# Patient Record
Sex: Female | Born: 1990 | Race: White | Hispanic: No | Marital: Single | Smoking: Former smoker
Health system: Southern US, Community
[De-identification: ages and names within clinical notes are randomized; demographics above are authoritative.]

## PROBLEM LIST (undated history)

## (undated) DIAGNOSIS — K449 Diaphragmatic hernia without obstruction or gangrene: Secondary | ICD-10-CM

## (undated) DIAGNOSIS — N83209 Unspecified ovarian cyst, unspecified side: Secondary | ICD-10-CM

## (undated) HISTORY — PX: SKIN GRAFT: SHX250

## (undated) HISTORY — PX: TOE AMPUTATION: SHX809

---

## 2014-03-04 ENCOUNTER — Encounter: Payer: Self-pay | Admitting: Emergency Medicine

## 2014-03-04 ENCOUNTER — Emergency Department
Admission: EM | Admit: 2014-03-04 | Discharge: 2014-03-04 | Disposition: A | Discharge status: Home / Self Care | Payer: Self-pay | Source: Home / Self Care | Attending: Family Medicine | Admitting: Family Medicine

## 2014-03-04 DIAGNOSIS — A499 Bacterial infection, unspecified: Secondary | ICD-10-CM

## 2014-03-04 DIAGNOSIS — Z202 Contact with and (suspected) exposure to infections with a predominantly sexual mode of transmission: Secondary | ICD-10-CM

## 2014-03-04 DIAGNOSIS — B9689 Other specified bacterial agents as the cause of diseases classified elsewhere: Secondary | ICD-10-CM

## 2014-03-04 DIAGNOSIS — N760 Acute vaginitis: Secondary | ICD-10-CM

## 2014-03-04 MED ORDER — METRONIDAZOLE 500 MG PO TABS: ORAL_TABLET | ORAL | Status: DC

## 2014-03-04 NOTE — ED Provider Notes (Signed)
CSN: [id number]     Arrival date & time 03/04/14  1030 History   First MD Initiated Contact with Patient 03/04/14 1126     Chief Complaint  Patient presents with  . Exposure to STD      HPI Comments: Patient states that she has just been informed by her sexual partner that he has dysuria, and she is concerned that she may have a STD.  She is assymptomatic at present.  She reports that she has had increased vaginal discharge for about two years, not associated with pain or irritation.  Patient's last menstrual period was 02/11/2014.    Patient is a 23 y.o. female presenting with STD exposure. The history is provided by the patient.  Exposure to STD This is a new problem. The current episode started yesterday. Associated symptoms comments: none.    History reviewed. No pertinent past medical history. Past Surgical History  Procedure Laterality Date  . 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 Diabetes Father   . Heart failure Father   . Hypertension Father    History  Substance Use Topics  . Smoking status: Former Games developer  . Smokeless tobacco: Not on file  . Alcohol Use: Yes   OB History   Grav Para Term Preterm Abortions TAB SAB Ect Mult Living                 Review of Systems  Constitutional: Negative for fever and chills.  Gastrointestinal: Negative for nausea and vomiting.  Genitourinary: Positive for vaginal discharge. Negative for dysuria, urgency, frequency, vaginal bleeding, difficulty urinating, genital sores, vaginal pain, menstrual problem, pelvic pain and dyspareunia.  All other systems reviewed and are negative.   Allergies  Review of patient's allergies indicates no known allergies.  Home Medications   Prior to Admission medications   Medication Sig Start Date End Date Taking? Authorizing Provider  metroNIDAZOLE (FLAGYL) 500 MG tablet Take one tab by mouth every 12 hours for 7 days. 03/04/14   Lattie Haw, MD   BP 125/77  Pulse 77  Temp(Src) 98.4 F (36.9 C) (Oral)  Resp 16  Ht 5\' 4"  (1.626 m)  Wt 170 lb (77.111 kg)  BMI 29.17 kg/m2  SpO2 99%  LMP 02/11/2014 Physical Exam Nursing notes and Vital Signs reviewed. Appearance:  Patient appears healthy, stated age, and in no acute distress Eyes:  Pupils are equal, round, and reactive to light and accomodation.  Extraocular movement is intact.  Conjunctivae are not inflamed  Pharynx:  Normal Neck:  Supple.  No adenopathy Lungs:  Clear to auscultation.  Breath sounds are equal.  Heart:  Regular rate and rhythm without murmurs, rubs, or gallops.  Abdomen:  Nontender without masses or hepatosplenomegaly.  Bowel sounds are present.  No CVA or flank tenderness.  Extremities:  No edema.  No calf tenderness Skin:  No rash present.  Pelvic exam deferred:  Patient instructed how to self obtain vaginal specimen.  ED Course  Procedures  none    Labs Reviewed  HIV ANTIBODY (ROUTINE TESTING)  HSV(HERPES SMPLX)ABS-I+II(IGG+IGM)-BLD  GC/CHLAMYDIA PROBE AMP, URINE  POCT WET + KOH PREP (DR PERFORMED @ [facility]);  Many clue cells.  No yeast, trich, WBC, RBC      MDM   1. Possible exposure to STD   2. Bacterial vaginosis    Begin Flagyl. GC/chlamydia, HIV, HSV pending. May try probiotic Fem-Dophilus for prevention of  BV Followup with Family Doctor if not improved in one week.     Lattie HawStephen A Thiago Ragsdale, MD 03/04/14 [id number]

## 2014-03-04 NOTE — ED Notes (Signed)
States partner having symptoms of STD so she wants to be checked; only reports chronic increase in vaginal discharge without odor or itching.

## 2014-03-04 NOTE — Discharge Instructions (Signed)
May try probiotic Fem-Dophilus for prevention of BV   Bacterial Vaginosis Bacterial vaginosis is a vaginal infection that occurs when the normal balance of bacteria in the vagina is disrupted. It results from an overgrowth of certain bacteria. This is the most common vaginal infection in women of childbearing age. Treatment is important to prevent complications, especially in pregnant women, as it can cause a premature delivery. CAUSES  Bacterial vaginosis is caused by an increase in harmful bacteria that are normally present in smaller amounts in the vagina. Several different kinds of bacteria can cause bacterial vaginosis. However, the reason that the condition develops is not fully understood. RISK FACTORS Certain activities or behaviors can put you at an increased risk of developing bacterial vaginosis, including:  Having a new sex partner or multiple sex partners.  Douching.  Using an intrauterine device (IUD) for contraception. Women do not get bacterial vaginosis from toilet seats, bedding, swimming pools, or contact with objects around them. SIGNS AND SYMPTOMS  Some women with bacterial vaginosis have no signs or symptoms. Common symptoms include:  Grey vaginal discharge.  A fishlike odor with discharge, especially after sexual intercourse.  Itching or burning of the vagina and vulva.  Burning or pain with urination. DIAGNOSIS  Your health care provider will take a medical history and examine the vagina for signs of bacterial vaginosis. A sample of vaginal fluid may be taken. Your health care provider will look at this sample under a microscope to check for bacteria and abnormal cells. A vaginal pH test may also be done.  TREATMENT  Bacterial vaginosis may be treated with antibiotic medicines. These may be given in the form of a pill or a vaginal cream. A second round of antibiotics may be prescribed if the condition comes back after treatment.  HOME CARE INSTRUCTIONS   Only  take over-the-counter or prescription medicines as directed by your health care provider.  If antibiotic medicine was prescribed, take it as directed. Make sure you finish it even if you start to feel better.  Do not have sex until treatment is completed.  Tell all sexual partners that you have a vaginal infection. They should see their health care provider and be treated if they have problems, such as a mild rash or itching.  Practice safe sex by using condoms and only having one sex partner. SEEK MEDICAL CARE IF:   Your symptoms are not improving after 3 days of treatment.  You have increased discharge or pain.  You have a fever. MAKE SURE YOU:   Understand these instructions.  Will watch your condition.  Will get help right away if you are not doing well or get worse. FOR MORE INFORMATION  Centers for Disease Control and Prevention, Division of STD Prevention: SolutionApps.co.zawww.cdc.gov/std American Sexual Health Association (ASHA): www.ashastd.org  Document Released: 07/29/2005 Document Revised: 05/19/2013 Document Reviewed: 03/10/2013 [facility] Patient Information 2015 [place], [place]. This information is not intended to replace advice given to you by your health care provider. Make sure you discuss any questions you have with your health care provider.

## 2014-03-05 LAB — HIV ANTIBODY (ROUTINE TESTING W REFLEX): HIV: NONREACTIVE

## 2014-03-05 LAB — GC/CHLAMYDIA PROBE AMP, URINE
CHLAMYDIA, SWAB/URINE, PCR: NEGATIVE
GC Probe Amp, Urine: NEGATIVE

## 2014-03-07 ENCOUNTER — Telehealth: Payer: Self-pay | Admitting: Emergency Medicine

## 2014-03-07 LAB — HSV(HERPES SMPLX)ABS-I+II(IGG+IGM)-BLD
HSV 1 Glycoprotein G Ab, IgG: 9.1 IV — ABNORMAL HIGH
HSV 2 Glycoprotein G Ab, IgG: <0.1 IV
Herpes Simplex Vrs I&II-IgM Ab (EIA): 0.8 INDEX

## 2014-03-07 LAB — POCT WET + KOH PREP
Bacteria Wet Prep HPF POC: 0
KOH Prep POC: NEGATIVE
RBC, Wet Prep: 0
WBC WET PREP: 0

## 2015-01-31 ENCOUNTER — Emergency Department (INDEPENDENT_AMBULATORY_CARE_PROVIDER_SITE_OTHER)
Admission: EM | Admit: 2015-01-31 | Discharge: 2015-01-31 | Disposition: A | Discharge status: Home / Self Care | Payer: Self-pay | Source: Home / Self Care | Attending: Emergency Medicine | Admitting: Emergency Medicine

## 2015-01-31 ENCOUNTER — Encounter: Payer: Self-pay | Admitting: *Deleted

## 2015-01-31 DIAGNOSIS — R300 Dysuria: Secondary | ICD-10-CM

## 2015-01-31 DIAGNOSIS — N3000 Acute cystitis without hematuria: Secondary | ICD-10-CM

## 2015-01-31 LAB — POCT URINALYSIS DIP (MANUAL ENTRY)
Blood, UA: NEGATIVE
Glucose, UA: 250 — AB
Nitrite, UA: POSITIVE — AB
Protein Ur, POC: 100 — AB
Spec Grav, UA: 1.01 (ref 1.005–1.03)
Urobilinogen, UA: >=8 (ref 0–1)
pH, UA: 5 (ref 5–8)

## 2015-01-31 MED ORDER — CIPROFLOXACIN HCL 500 MG PO TABS: 500.0000 mg | ORAL_TABLET | Freq: Two times a day (BID) | ORAL | Status: DC

## 2015-01-31 NOTE — ED Notes (Signed)
Pt c/o dysuria and urinary frequency x 2 days. Taking AZO. Denies fever.

## 2015-01-31 NOTE — ED Provider Notes (Signed)
CSN: [id number]     Arrival date & time 01/31/15  1726 History   First MD Initiated Contact with Patient 01/31/15 1731     Chief Complaint  Patient presents with  . Dysuria   (Consider location/radiation/quality/duration/timing/severity/associated sxs/prior Treatment) HPI This is a 24 y.o. female who presents today with UTI symptoms for 3 days.  + dysuria + frequency + urgency No hematuria No vaginal discharge No fever/chills No lower abdominal pain No nausea No vomiting No back pain No fatigue She denies chance of pregnancy.--- Her periods have been regular. Last menstrual period started 3 days ago and is ending today. Has tried over-the-counter--Azo- measures with mild improvement   History of UTIs in the past. Last UTI was about one year ago and resolved with antibiotic without sequelae. History reviewed. No pertinent past medical history. Past Surgical History  Procedure Laterality Date  . 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 Diabetes Father   . Heart failure Father   . Hypertension Father    History  Substance Use Topics  . Smoking status: Current Every Day Smoker -- 0.50 packs/day    Types: Cigarettes  . Smokeless tobacco: Not on file  . Alcohol Use: Yes   OB History    No data available     Review of Systems Remainder of Review of Systems negative for acute change except as noted in the HPI.  Allergies  Review of patient's allergies indicates no known allergies.  Home Medications   Prior to Admission medications   Medication Sig Start Date End Date Taking? Authorizing Provider  ciprofloxacin (CIPRO) 500 MG tablet Take 1 tablet (500 mg total) by mouth 2 (two) times daily. For 7 days 01/31/15   Lajean Manes, MD   BP 135/84 mmHg  Pulse 68  Temp(Src) 98.9 F (37.2 C) (Oral)  Resp 16  Ht 5\' 5"  (1.651 m)  Wt 190 lb (86.183 kg)  BMI 31.62 kg/m2  SpO2 97%  LMP 01/20/2015 Physical Exam   Constitutional: She is oriented to person, place, and time. She appears well-developed and well-nourished. No distress.  HENT:  Head: Normocephalic and atraumatic.  Eyes: Conjunctivae and EOM are normal. Pupils are equal, round, and reactive to light. No scleral icterus.  Neck: Normal range of motion.  Cardiovascular: Normal rate.   Pulmonary/Chest: Effort normal.  Abdominal: She exhibits no distension.  Musculoskeletal: Normal range of motion.  Neurological: She is alert and oriented to person, place, and time.  Skin: Skin is warm.  Psychiatric: She has a normal mood and affect.  Nursing note and vitals reviewed.  back and abdomen nontender. She denied any other exam ED Course  Procedures (including critical care time) Labs Review Labs Reviewed  POCT URINALYSIS DIP (MANUAL ENTRY) - Abnormal; Notable for the following:    Color, UA orange (*)    Glucose, UA =250 (*)    Bilirubin, UA moderate (*)    Bilirubin, UA small (15) (*)    Protein Ur, POC =100 (*)    Nitrite, UA Positive (*)    Leukocytes, UA large (3+) (*)    All other components within normal limits  URINE CULTURE    Imaging Review No results found.   MDM   1. Dysuria   2. Acute cystitis without hematuria    Treatment options discussed, as well as risks, benefits, alternatives. Patient voiced understanding and agreement with the following plans: Urine culture sent.  She denies any chance of pregnancy and is just finishing her normal menstrual period, will prescribe : Discharge Medication List as of 01/31/2015  6:00 PM    START taking these medications   Details  ciprofloxacin (CIPRO) 500 MG tablet Take 1 tablet (500 mg total) by mouth 2 (two) times daily. For 7 days, Starting 01/31/2015, Until Discontinued, Normal       Other symptomatic care discussed. Follow-up with your primary care doctor in 5-7 days if not improving, or sooner if symptoms become worse. Although she has no chronic symptoms of diabetes  and no history of diabetes, there is + glucose in urine. We discussed this briefly, and I advised her to follow up with PCP in 10 days to have urine rechecked to be sure that urinalysis is completely normalized. Precautions discussed. Red flags discussed. Questions invited and answered. Patient voiced understanding and agreement.     Lajean Manes, MD 01/31/15 [phone]

## 2015-02-02 LAB — URINE CULTURE
Colony Count: NO GROWTH
Organism ID, Bacteria: NO GROWTH

## 2015-02-03 ENCOUNTER — Telehealth: Payer: Self-pay | Admitting: Emergency Medicine

## 2015-12-16 ENCOUNTER — Emergency Department (INDEPENDENT_AMBULATORY_CARE_PROVIDER_SITE_OTHER): Payer: Self-pay

## 2015-12-16 ENCOUNTER — Emergency Department
Admission: EM | Admit: 2015-12-16 | Discharge: 2015-12-16 | Disposition: A | Discharge status: Home / Self Care | Payer: Self-pay | Source: Home / Self Care | Attending: Emergency Medicine | Admitting: Emergency Medicine

## 2015-12-16 ENCOUNTER — Encounter: Payer: Self-pay | Admitting: Emergency Medicine

## 2015-12-16 DIAGNOSIS — M25511 Pain in right shoulder: Secondary | ICD-10-CM

## 2015-12-16 DIAGNOSIS — S46911A Strain of unspecified muscle, fascia and tendon at shoulder and upper arm level, right arm, initial encounter: Secondary | ICD-10-CM

## 2015-12-16 MED ORDER — KETOROLAC TROMETHAMINE 60 MG/2ML IM SOLN
60.0000 mg | Freq: Once | INTRAMUSCULAR | Status: AC
  Administered 2015-12-16: 60 mg via INTRAMUSCULAR

## 2015-12-16 MED ORDER — TRAMADOL HCL 50 MG PO TABS: ORAL_TABLET | ORAL | Status: DC

## 2015-12-16 MED ORDER — MELOXICAM 15 MG PO TABS: 15.0000 mg | ORAL_TABLET | Freq: Every day | ORAL | Status: DC

## 2015-12-16 NOTE — ED Notes (Signed)
[facility] in [facility] right shoulder are after pushing herself up in a chair this morning. C/O of shooting sharp pain after feeling a and hearing a pop. Limited ROM and rates pain 5/10 sharp in nature radiates into neck.

## 2015-12-16 NOTE — Discharge Instructions (Signed)

## 2015-12-16 NOTE — ED Provider Notes (Addendum)
CSN: [id number]     Arrival date & time 12/16/15  1403 History   First MD Initiated Contact with Patient 12/16/15 1412     Chief Complaint  Patient presents with  . Shoulder Pain   (Consider location/radiation/quality/duration/timing/severity/associated sxs/prior Treatment) HPI [facility] Severe sharp, shooting pain in the right shoulder area after pushing herself up in a chair this morning. C/O of shooting sharp pain, 7 out of 10, after feeling and hearing a pop. Limited ROM. The right anterolateral shoulder pain is sharp, radiates to right lateral neck and radiates at times down right arm. Associated with feeling of numbness in right arm and hand and right hand feels cool . She denies prior injury or surgery right shoulder in the past, but had a left shoulder injury years ago that resolved without sequelae.  History reviewed. No pertinent past medical history. Past Surgical History  Procedure Laterality Date  . 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 Diabetes Father   . Heart failure Father   . Hypertension Father    Social History  Substance Use Topics  . Smoking status: Current Every Day Smoker -- 0.50 packs/day    Types: Cigarettes  . Smokeless tobacco: None  . Alcohol Use: Yes   OB History    No data available     Review of Systems  All other systems reviewed and are negative.   Allergies  Review of patient's allergies indicates no known allergies.  Home Medications   Prior to Admission medications   Medication Sig Start Date End Date Taking? Authorizing Provider  meloxicam (MOBIC) 15 MG tablet Take 1 tablet (15 mg total) by mouth daily. As needed for pain and inflammation. Take with food.  Do not take with other NSAIDs 12/16/15   Lajean Manesavid Massey, MD  traMADol Janean Sark(ULTRAM) 50 MG tablet Take 1 every 8 hours as needed for severe pain- May cause drowsiness 12/16/15   Lajean Manesavid Massey, MD   Meds Ordered and  Administered this Visit   Medications  ketorolac (TORADOL) injection 60 mg (60 mg Intramuscular Given 12/16/15 1505)    BP 162/85 mmHg  Pulse 84  Temp(Src) 98.4 F (36.9 C) (Oral)  Resp 16  Ht 5\' 4"  (1.626 m)  Wt 196 lb 8 oz (89.132 kg)  BMI 33.71 kg/m2  SpO2 100%  LMP 12/13/2015 No data found.   Physical Exam  Constitutional: She is oriented to person, place, and time. She appears well-developed and well-nourished. No distress.  Alert, cooperative, pleasant female. Uncomfortable from right shoulder pain. She holds and splints her right arm in order to splint right shoulder to avoid pain  HENT:  Head: Normocephalic and atraumatic.  Eyes: Conjunctivae and EOM are normal. Pupils are equal, round, and reactive to light. No scleral icterus.  Neck: Normal range of motion.  Cardiovascular: Normal rate.   Pulmonary/Chest: Effort normal.  Abdominal: She exhibits no distension.  Musculoskeletal:       Right shoulder: She exhibits decreased range of motion, tenderness, bony tenderness and pain. She exhibits no deformity, normal pulse and normal strength.       Cervical back: Normal. She exhibits normal range of motion, no tenderness, no bony tenderness and no swelling.  Motor and sensory and capillary refill distally intact. Radial pulse normal. Right hand is mildly cool temperature, exactly the same mild cool temperature as left hand.  Neurological: She is alert and oriented  to person, place, and time. No cranial nerve deficit. She exhibits normal muscle tone.  Motor and sensory upper extremities intact bilaterally  Skin: Skin is warm. No rash noted.  Psychiatric: She has a normal mood and affect.  Nursing note and vitals reviewed.   ED Course  Procedures (including critical care time)  Labs Review Labs Reviewed - No data to display  Imaging Review Dg Shoulder Right  12/16/2015  CLINICAL DATA:  Acute onset pain. EXAM: RIGHT SHOULDER - 2+ VIEW COMPARISON:  None. FINDINGS: Frontal,  Y scapular, and axillary images were obtained. No fracture or dislocation. The joint spaces appear normal. No erosive change or intra-articular calcification. IMPRESSION: No fracture or dislocation.  No appreciable arthropathy. Electronically Signed   By: Bretta Bang III M.D.   On: 12/16/2015 14:45      MDM   1. Right shoulder strain, initial encounter   Neurovascular right upper extremity intact  Treatment options discussed, as well as risks, benefits, alternatives. Patient voiced understanding and agreement with the following plans:  Shoulder immobilizer applied right shoulder, and that relieved some of the pain. Toradol 60 mg IM stat. After 30 minutes, that relieved some of the pain, and upon discharge, pain was 4 out of 10. New Prescriptions   MELOXICAM (MOBIC) 15 MG TABLET    Take 1 tablet (15 mg total) by mouth daily. As needed for pain and inflammation. Take with food.  Do not take with other NSAIDs   TRAMADOL (ULTRAM) 50 MG TABLET    Take 1 every 8 hours as needed for severe pain- May cause drowsiness    See detailed instructions in AVS, which were given to patient. Follow-up with orthopedics or sports medicine within one week Verbal instructions also given.  Red flags discussed. Questions invited and answered.  She voiced understanding and agreement with plans.   Lajean Manes, MD 12/16/15 Berton Bon  Lajean Manes, MD 12/16/15 2005

## 2015-12-21 ENCOUNTER — Telehealth: Payer: Self-pay | Admitting: Emergency Medicine

## 2016-04-24 ENCOUNTER — Emergency Department (INDEPENDENT_AMBULATORY_CARE_PROVIDER_SITE_OTHER)
Admission: EM | Admit: 2016-04-24 | Discharge: 2016-04-24 | Disposition: A | Discharge status: Home / Self Care | Payer: Self-pay | Source: Home / Self Care | Attending: Family Medicine | Admitting: Family Medicine

## 2016-04-24 ENCOUNTER — Encounter: Payer: Self-pay | Admitting: *Deleted

## 2016-04-24 DIAGNOSIS — R140 Abdominal distension (gaseous): Secondary | ICD-10-CM

## 2016-04-24 DIAGNOSIS — R1084 Generalized abdominal pain: Secondary | ICD-10-CM

## 2016-04-24 DIAGNOSIS — R198 Other specified symptoms and signs involving the digestive system and abdomen: Secondary | ICD-10-CM

## 2016-04-24 HISTORY — DX: Diaphragmatic hernia without obstruction or gangrene: K44.9

## 2016-04-24 HISTORY — DX: Unspecified ovarian cyst, unspecified side: N83.209

## 2016-04-24 LAB — POCT CBC W AUTO DIFF (K'VILLE URGENT CARE)

## 2016-04-24 MED ORDER — DICYCLOMINE HCL 20 MG PO TABS: 20.0000 mg | ORAL_TABLET | Freq: Two times a day (BID) | ORAL | 0 refills | Status: DC

## 2016-04-24 NOTE — ED Provider Notes (Signed)
CSN: [id number]     Arrival date & time 04/24/16  1302 History   First MD Initiated Contact with Patient 04/24/16 1334     Chief Complaint  Patient presents with  . Abdominal Pain   (Consider location/radiation/quality/duration/timing/severity/associated sxs/prior Treatment) HPI Jenna Norris is a 25 y.o. female presenting to UC with c/o generalized abdominal cramping and bloating that is worse on Left side of mid abdomen for about 9 days.  Pain waxes and wanes.  She did try Pepto-bismol once and omeprazole as she has hx of hiatal hernia but no relief. She also reports hx of ovarian cyst and had one rupture recently but states that is a different pain. Denies fever, chills, nausea or vomiting but has had intermittent diarrhea and constipation.  Denies hx of abdominal surgeries. No urinary or vaginal symptoms at this time.    Past Medical History:  Diagnosis Date  . Hiatal hernia   . Ovarian cyst    left    Past Surgical History:  Procedure Laterality Date  . SKIN GRAFT    . TOE AMPUTATION     repair after injury   Family History  Problem Relation Age of Onset  . Diabetes Father   . Heart failure Father   . Hypertension Father    Social History  Substance Use Topics  . Smoking status: Former Smoker    Packs/day: 0.50    Types: Cigarettes  . Smokeless tobacco: Never Used     Comment: uses vapes  . Alcohol use Yes   OB History    No data available     Review of Systems  Constitutional: Negative for chills and fever.  Gastrointestinal: Positive for abdominal pain ( Left side). Negative for diarrhea, nausea and vomiting.  Genitourinary: Positive for flank pain (Left side). Negative for dysuria, frequency, hematuria, pelvic pain and urgency.  Musculoskeletal: Negative for back pain and myalgias.    Allergies  Review of patient's allergies indicates no known allergies.  Home Medications   Prior to Admission medications   Medication Sig Start Date End Date Taking?  Authorizing Provider  omeprazole (PRILOSEC) 20 MG capsule Take 20 mg by mouth daily as needed.   Yes Historical Provider, MD  dicyclomine (BENTYL) 20 MG tablet Take 1 tablet (20 mg total) by mouth 2 (two) times daily. 04/24/16   Junius FinnerErin O'Malley, PA-C   Meds Ordered and Administered this Visit  Medications - No data to display  BP 126/82 (BP Location: Left Arm)   Pulse 78   Temp 98.4 F (36.9 C) (Oral)   Resp 16   Ht 5\' 4"  (1.626 m)   Wt 194 lb (88 kg)   LMP 04/03/2016   SpO2 99%   BMI 33.30 kg/m  No data found.   Physical Exam  Constitutional: She appears well-developed and well-nourished. No distress.  HENT:  Head: Normocephalic and atraumatic.  Eyes: Conjunctivae are normal. No scleral icterus.  Neck: Normal range of motion.  Cardiovascular: Normal rate, regular rhythm and normal heart sounds.   Pulmonary/Chest: Effort normal and breath sounds normal. No respiratory distress. She has no wheezes. She has no rales. She exhibits no tenderness.  Abdominal: Soft. Bowel sounds are normal. She exhibits no distension and no mass. There is tenderness. There is no rebound and no guarding.  Soft, non-distended, non-tender. No CVAT   Musculoskeletal: Normal range of motion.  Neurological: She is alert.  Skin: Skin is warm and dry. She is not diaphoretic.  Nursing note and vitals reviewed.  Urgent Care Course   Clinical Course    Procedures (including critical care time)  Labs Review Labs Reviewed  COMPLETE METABOLIC PANEL WITH GFR  POCT CBC W AUTO DIFF ([facility])    Imaging Review No results found.   MDM   1. Generalized abdominal pain   2. Bloating   3. Alternating constipation and diarrhea    Pt c/o intermittent abdominal pain for about 9 days. Intermittent diarrhea, constipation and bloating. Abd- soft, non-distended and non-tender.  LMP 3 weeks ago. Pt not concerned for pregnancy.   Not concerned for surgical abdomen at this time.    Labs: CBC-  WNL CMP- pending.  Rx: bentyl   Home care instructions provided. F/u with PCP in 1 week if not improving. Discussed symptoms that warrant emergent care in the ED. Patient verbalized understanding and agreement with treatment plan.    Junius Finner, PA-C 04/24/16 1526

## 2016-04-24 NOTE — ED Triage Notes (Signed)
Pt c/o LT sided mid abd pain x 9 days. She reports bloating, diarrhea,constipation and fatigue. Denies fever. Reports hx of hiatal hernia and LT side ovarian cyst rupture.

## 2016-04-25 LAB — COMPLETE METABOLIC PANEL WITH GFR
ALT: 11 U/L (ref 6–29)
AST: 12 U/L (ref 10–30)
Albumin: 4.2 g/dL (ref 3.6–5.1)
Alkaline Phosphatase: 44 U/L (ref 33–115)
BUN: 13 mg/dL (ref 7–25)
CO2: 26 mmol/L (ref 20–31)
Calcium: 9.2 mg/dL (ref 8.6–10.2)
Chloride: 107 mmol/L (ref 98–110)
Creat: 0.82 mg/dL (ref 0.50–1.10)
GFR, Est African American: >89 mL/min (ref >=60)
GFR, Est Non African American: >89 mL/min (ref >=60)
Glucose, Bld: 72 mg/dL (ref 65–99)
Potassium: 4.2 mmol/L (ref 3.5–5.3)
Sodium: 138 mmol/L (ref 135–146)
Total Bilirubin: 0.5 mg/dL (ref 0.2–1.2)
Total Protein: 6.4 g/dL (ref 6.1–8.1)

## 2016-04-26 NOTE — ED Triage Notes (Signed)
Pt advised of normal CMP. States she is still having diarrhea with eating. Advised her she needs to see a pcp. Gave her number to primary care and told her she needs to f./u with them as additional testing may be needed.

## 2016-05-21 ENCOUNTER — Ambulatory Visit (INDEPENDENT_AMBULATORY_CARE_PROVIDER_SITE_OTHER): Payer: Self-pay | Admitting: Osteopathic Medicine

## 2016-05-21 ENCOUNTER — Encounter: Payer: Self-pay | Admitting: Osteopathic Medicine

## 2016-05-21 VITALS — BP 129/77 | HR 80 | Ht 64.0 in | Wt 191.0 lb

## 2016-05-21 DIAGNOSIS — R140 Abdominal distension (gaseous): Secondary | ICD-10-CM

## 2016-05-21 DIAGNOSIS — Z8719 Personal history of other diseases of the digestive system: Secondary | ICD-10-CM

## 2016-05-21 DIAGNOSIS — R198 Other specified symptoms and signs involving the digestive system and abdomen: Secondary | ICD-10-CM

## 2016-05-21 DIAGNOSIS — Z23 Encounter for immunization: Secondary | ICD-10-CM

## 2016-05-21 MED ORDER — DICYCLOMINE HCL 20 MG PO TABS: 20.0000 mg | ORAL_TABLET | Freq: Three times a day (TID) | ORAL | 0 refills | Status: DC

## 2016-05-21 NOTE — Patient Instructions (Signed)
Avoid the following foods, feel free to Google recipes and shopping lists!  2017 UpToDate Characteristics and sources of common FODMAPs  Word that corresponds to letter in acronym Compounds in this category Foods that contain these compounds  F Fermentable  O Oligosaccharides Fructans, galacto-oligosaccharides Wheat, barley, rye, onion, leek, white part of spring onion, garlic, shallots, artichokes, beetroot, fennel, peas, chicory, pistachio, cashews, legumes, lentils, and chickpeas  D Disaccharides Lactose Milk, custard, ice cream, and yogurt  M Monosaccharides "Free fructose" (fructose in excess of glucose) Apples, pears, mangoes, cherries, watermelon, asparagus, sugar snap peas, honey, high-fructose corn syrup  A And  P Polyols Sorbitol, mannitol, maltitol, and xylitol Apples, pears, apricots, cherries, nectarines, peaches, plums, watermelon, mushrooms, cauliflower, artificially sweetened chewing gum and confectionery  FODMAPs: fermentable oligosaccharides, disaccharides, monosaccharides, and polyols. Adapted by permission from [employer]: [employer] Br[employer]an Journal of Gastroenterology. Lonell FaceShepherd SJ, Lomer MC, ZacharyGibson [place]. Short-chain carbohydrates and functional gastrointestinal disorders. Am J Gastroenterol 2013; 108:707. Copyright  2013. www.nature.com/ajg. Graphic 1610990186 Version 2.0  Ok to use Imodium as needed for diarrhea. We placed referral for GI for further evaluation if you're not doing any better.

## 2016-05-21 NOTE — Progress Notes (Signed)
HPI: Jenna Norris is a 25 y.o. female  who presents to [facility] today, 05/21/16,  for chief complaint of:  Chief Complaint  Patient presents with  . Establish Care    ABDOMINAL BLOATING    Has had stomach issues with food. Causing some bloating/pain on left upper abdomen/flank, as well as frequent loose stool, occasionally multiple times per day, is not waking her up at night. Symptoms happen particularly with consumption of gluten and dairy but elimination diet wasn't very helpful. Bloating overall is doing a bit better since was given prescription for Bentyl from urgent care. Overall has been going on for about 2 months. History of hiatal hernia, intermittently taking PPI as needed.   Past medical, surgical, social and family history reviewed: Past Medical History:  Diagnosis Date  . Hiatal hernia   . Ovarian cyst    left    Past Surgical History:  Procedure Laterality Date  . SKIN GRAFT    . 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 Smoking status: Former Smoker    Packs/day: 0.50    Types: Cigarettes  . Smokeless tobacco: Never Used     Comment: uses vapes  . Alcohol use Yes   Family History  Problem Relation Age of Onset  . Diabetes Father   . Heart failure Father   . Hypertension Father   . Hyperlipidemia Father   . Cancer Paternal Grandmother     BREAST AND LEUKEMIA     Current medication list and allergy/intolerance information reviewed:   Current Outpatient Prescriptions  Medication Sig Dispense Refill  . Biotin 1000 MCG tablet Take 1,000 mcg by mouth 3 (three) times daily.    Marland Kitchen omeprazole (PRILOSEC) 20 MG capsule Take 20 mg by mouth daily as needed.    . Prenatal Vit-Fe Fumarate-FA (PRENATAL MULTIVITAMIN) TABS tablet Take 1 tablet by mouth daily at 12 noon.     No current facility-administered medications for this visit.    No Known Allergies    Review of  Systems:  Constitutional:  No  fever, no chills, No recent illness, No unintentional weight changes. No significant fatigue.   HEENT: No  headache, no vision change  Cardiac: No  chest pain, No  pressure  Respiratory:  No  shortness of breath.   Gastrointestinal: +abdominal pain, +nausea, No  vomiting,  No  blood in stool, +diarrhea, +constipation   Musculoskeletal: No new myalgia/arthralgia  Genitourinary: No  incontinence, No  abnormal genital bleeding, No abnormal genital discharge  Skin: No  Rash, No other wounds/concerning lesions  Hem/Onc: No  easy bruising/bleeding, No  abnormal lymph node  Endocrine: No cold intolerance,  No heat intolerance. No polyuria/polydipsia/polyphagia   Neurologic: No  weakness, No  dizziness,  Psychiatric: No  concerns with depression, No  concerns with anxiety  Exam:  BP 129/77   Pulse 80   Ht 5\' 4"  (1.626 m)   Wt 191 lb (86.6 kg)   LMP 04/03/2016   BMI 32.79 kg/m   Constitutional: VS see above. General Appearance: alert, well-developed, well-nourished, NAD  Eyes: Normal lids and conjunctive, non-icteric sclera  Ears, Nose, Mouth, Throat: MMM, Normal external inspection ears/nares/mouth/lips/gums.   Neck: No masses, trachea midline. No thyroid enlargement.   Respiratory: Normal respiratory effort. no wheeze, no rhonchi, no rales  Cardiovascular: S1/S2 normal, no murmur, no rub/gallop auscultated. RRR. No lower extremity edema.   Gastrointestinal: Nontender, no masses. No  hepatomegaly, no splenomegaly. No hernia appreciated. Bowel sounds normal. Rectal exam deferred.   Musculoskeletal: Gait normal.   Neurological: Normal balance/coordination. No tremor.   Skin: warm, dry, intact. No rash/ulcer.   Psychiatric: Normal judgment/insight. Normal mood and affect. Oriented x3.    ASSESSMENT/PLAN:   Alternating constipation and diarrhea - Plan: TSH, Tissue transglutaminase, IgA, Tissue transglutaminase, IgG, dicyclomine (BENTYL) 20  MG tablet, Ambulatory referral to Gastroenterology  Need for prophylactic vaccination and inoculation against influenza - Plan: Flu Vaccine QUAD 36+ mos IM  History of hiatal hernia - Plan: Ambulatory referral to Gastroenterology  Abdominal bloating - Plan: Ambulatory referral to Gastroenterology   Patient Instructions   Avoid the following foods, feel free to Google recipes and shopping lists!  2017 UpToDate Characteristics and sources of common FODMAPs  Word that corresponds to letter in acronym Compounds in this category Foods that contain these compounds  F Fermentable  O Oligosaccharides Fructans, galacto-oligosaccharides Wheat, barley, rye, onion, leek, white part of spring onion, garlic, shallots, artichokes, beetroot, fennel, peas, chicory, pistachio, cashews, legumes, lentils, and chickpeas  D Disaccharides Lactose Milk, custard, ice cream, and yogurt  M Monosaccharides "Free fructose" (fructose in excess of glucose) Apples, pears, mangoes, cherries, watermelon, asparagus, sugar snap peas, honey, high-fructose corn syrup  A And  P Polyols Sorbitol, mannitol, maltitol, and xylitol Apples, pears, apricots, cherries, nectarines, peaches, plums, watermelon, mushrooms, cauliflower, artificially sweetened chewing gum and confectionery  FODMAPs: fermentable oligosaccharides, disaccharides, monosaccharides, and polyols. Adapted by permission from [employer]: [employer] Journal of Gastroenterology. Lonell FaceShepherd SJ, Lomer MC, [employer]. Short-chain carbohydrates and functional gastrointestinal disorders. Am J Gastroenterol 2013; 108:707. Copyright  2013. www.nature.com/ajg. Graphic [id number] Version 2.0  Ok to use Imodium as needed for diarrhea. We placed referral for GI for further evaluation if you're not doing any better.    Visit summary with medication list and pertinent instructions was printed for patient to review. All questions at time of visit were answered - patient  instructed to contact office with any additional concerns. ER/RTC precautions were reviewed with the patient. Follow-up plan: Return if symptoms worsen or fail to improve.

## 2016-05-22 ENCOUNTER — Encounter: Payer: Self-pay | Admitting: Gastroenterology

## 2016-07-23 ENCOUNTER — Encounter: Payer: Self-pay | Admitting: Gastroenterology

## 2016-07-24 ENCOUNTER — Ambulatory Visit: Payer: Self-pay | Admitting: Gastroenterology

## 2016-10-09 ENCOUNTER — Ambulatory Visit: Payer: Self-pay | Admitting: Gastroenterology

## 2016-10-09 ENCOUNTER — Encounter: Payer: Self-pay | Admitting: *Deleted

## 2016-11-25 ENCOUNTER — Emergency Department
Admission: EM | Admit: 2016-11-25 | Discharge: 2016-11-25 | Disposition: A | Discharge status: Home / Self Care | Payer: Self-pay | Source: Home / Self Care | Attending: Family Medicine | Admitting: Family Medicine

## 2016-11-25 ENCOUNTER — Encounter: Payer: Self-pay | Admitting: Emergency Medicine

## 2016-11-25 DIAGNOSIS — Z202 Contact with and (suspected) exposure to infections with a predominantly sexual mode of transmission: Secondary | ICD-10-CM

## 2016-11-25 DIAGNOSIS — N760 Acute vaginitis: Secondary | ICD-10-CM

## 2016-11-25 DIAGNOSIS — B9689 Other specified bacterial agents as the cause of diseases classified elsewhere: Secondary | ICD-10-CM

## 2016-11-25 MED ORDER — METRONIDAZOLE 500 MG PO TABS: ORAL_TABLET | ORAL | 0 refills | Status: DC

## 2016-11-25 NOTE — ED Triage Notes (Addendum)
Pt c/o yellow vaginal d/c that started yesterday. Denies odor or itching. No recent intercourse.  Password [email or web address]

## 2016-11-25 NOTE — ED Provider Notes (Signed)
[facility]    CSN: [id number] Arrival date & time: 11/25/16  1731     History   Chief Complaint Chief Complaint  Patient presents with  . Vaginal Discharge    HPI Jenna Norris is a 26 y.o. female.   Patient reports that she developed light vaginal discharge one week ago, and the discharge became yellowish yesterday without abdominal or pelvic pain.  No fevers, chills, and sweats.  No nausea/vomiting.  Last menstrual period November 08, 2016.  She states that she would like to be tested for STD's.  She has a history of HSV1.   The history is provided by the patient.  Vaginal Discharge  Quality:  Yellow Severity:  Mild Onset quality:  Gradual Duration:  1 week Timing:  Intermittent Progression:  Worsening Chronicity:  New Context: not recent antibiotic use   Relieved by:  None tried Worsened by:  Nothing Ineffective treatments:  None tried Associated symptoms: no abdominal pain, no dyspareunia, no dysuria, no fever, no genital lesions, no nausea, no rash, no urinary frequency, no urinary hesitancy, no urinary incontinence, no vaginal itching and no vomiting     Past Medical History:  Diagnosis Date  . Hiatal hernia   . Ovarian cyst    left     There are no active problems to display for this patient.   Past Surgical History:  Procedure Laterality Date  . SKIN GRAFT    . 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 Authorizing Provider  Biotin 1000 MCG tablet Take 1,000 mcg by mouth 3 (three) times daily.    Historical Provider, MD  dicyclomine (BENTYL) 20 MG tablet Take 1 tablet (20 mg total) by mouth 4 (four) times daily -  before meals and at bedtime. As needed for bloating/cramping 05/21/16   Sunnie Nielsen, DO  metroNIDAZOLE (FLAGYL) 500 MG tablet Take one tab by mouth every 12 hours for 7 days. 11/25/16    Lattie Haw, MD  omeprazole (PRILOSEC) 20 MG capsule Take 20 mg by mouth daily as needed.    Historical Provider, MD  Prenatal Vit-Fe Fumarate-FA (PRENATAL MULTIVITAMIN) TABS tablet Take 1 tablet by mouth daily at 12 noon.    Historical Provider, MD    Family History Family History  Problem Relation Age of Onset  . Diabetes Father   . Heart failure Father   . Hypertension Father   . Hyperlipidemia Father   . Cancer Paternal Grandmother     BREAST AND LEUKEMIA    Social History Social History  Substance Use Topics  . Smoking status: Current Every Day Smoker    Packs/day: 0.50    Types: Cigarettes  . Smokeless tobacco: Never Used     Comment: uses vapes  . Alcohol use Yes     Allergies   Patient has no known allergies.   Review of Systems Review of Systems  Constitutional: Negative for fever.  Gastrointestinal: Negative for abdominal pain, nausea and vomiting.  Genitourinary: Positive for vaginal discharge. Negative for bladder incontinence, dyspareunia, dysuria and hesitancy.  All other systems reviewed and are negative.    Physical Exam Triage Vital Signs ED Triage Vitals [11/25/16 1809]  Enc Vitals Group     BP (!) 146/93     Pulse Rate 69  Resp      Temp 98.5 F (36.9 C)     Temp Source Oral     SpO2 97 %     Weight 204 lb (92.5 kg)     Height      Head Circumference      Peak Flow      Pain Score 3     Pain Loc      Pain Edu?      Excl. in GC?    No data found.   Updated Vital Signs BP (!) 146/93 (BP Location: Right Arm)   Pulse 69   Temp 98.5 F (36.9 C) (Oral)   Wt 204 lb (92.5 kg)   SpO2 97%   BMI 35.02 kg/m   Visual Acuity Right Eye Distance:   Left Eye Distance:   Bilateral Distance:    Right Eye Near:   Left Eye Near:    Bilateral Near:     Physical Exam Nursing notes and Vital Signs reviewed. Appearance:  Patient appears stated age, and in no acute distress.    Eyes:  Pupils are equal, round, and reactive to light  and accomodation.  Extraocular movement is intact.  Conjunctivae are not inflamed   Pharynx:  Normal; moist mucous membranes  Neck:  Supple.  No adenopathy Lungs:  Clear to auscultation.  Breath sounds are equal.  Moving air well. Heart:  Regular rate and rhythm without murmurs, rubs, or gallops.  Abdomen:  Nontender without masses or hepatosplenomegaly.  Bowel sounds are present.  No CVA or flank tenderness.  Extremities:  No edema.  Skin:  No rash present.     UC Treatments / Results  Labs (all labs ordered are listed, but only abnormal results are displayed) Labs Reviewed  GC/CHLAMYDIA PROBE AMP  HIV ANTIBODY (ROUTINE TESTING)  RPR  HSV(HERPES SIMPLEX VRS) I + II AB-IGG  POCT WET + KOH PREP:  Epithelial cells adequate; Clue cells present; WBC's moderate, no Trich, no yeast.    EKG  EKG Interpretation None       Radiology No results found.  Procedures Procedures (including critical care time)  Medications Ordered in UC Medications - No data to display   Initial Impression / Assessment and Plan / UC Course  I have reviewed the triage vital signs and the nursing notes.  Pertinent labs & imaging results that were available during my care of the patient were reviewed by me and considered in my medical decision making (see chart for details).    Begin Flagyl. GC/chlamydia, RPR, HIV, and HSV 1 & 2 IgG pending. Followup with Family Doctor if not improved in one week.     Final Clinical Impressions(s) / UC Diagnoses   Final diagnoses:  Possible exposure to STD  BV (bacterial vaginosis)    New Prescriptions New Prescriptions   METRONIDAZOLE (FLAGYL) 500 MG TABLET    Take one tab by mouth every 12 hours for 7 days.     Lattie Haw, MD 12/07/16 [phone]

## 2016-11-26 LAB — HIV ANTIBODY (ROUTINE TESTING W REFLEX): HIV 1&2 Ab, 4th Generation: NONREACTIVE

## 2016-11-26 LAB — HSV(HERPES SIMPLEX VRS) I + II AB-IGG
HSV 1 GLYCOPROTEIN G AB, IGG: 47.5 {index} — AB (ref <0.90)
HSV 2 Glycoprotein G Ab, IgG: <0.9 Index (ref <0.90)

## 2016-11-26 LAB — GC/CHLAMYDIA PROBE AMP
CT PROBE, AMP APTIMA: NOT DETECTED
GC Probe RNA: NOT DETECTED

## 2016-11-26 LAB — RPR

## 2016-11-27 ENCOUNTER — Telehealth: Payer: Self-pay | Admitting: Emergency Medicine

## 2017-02-25 ENCOUNTER — Encounter: Payer: Self-pay | Admitting: *Deleted

## 2017-02-25 ENCOUNTER — Emergency Department
Admission: EM | Admit: 2017-02-25 | Discharge: 2017-02-25 | Disposition: A | Discharge status: Home / Self Care | Payer: Self-pay | Source: Home / Self Care | Attending: Family Medicine | Admitting: Family Medicine

## 2017-02-25 DIAGNOSIS — R300 Dysuria: Secondary | ICD-10-CM

## 2017-02-25 MED ORDER — NITROFURANTOIN MONOHYD MACRO 100 MG PO CAPS: 100.0000 mg | ORAL_CAPSULE | Freq: Two times a day (BID) | ORAL | 0 refills | Status: DC

## 2017-02-25 NOTE — Discharge Instructions (Signed)
Increase fluid intake. May use non-prescription AZO for about two days, if desired, to decrease urinary discomfort.  If symptoms become significantly worse during the night or over the weekend, proceed to the local emergency room.  

## 2017-02-25 NOTE — ED Provider Notes (Signed)
[facility]    CSN: [id number] Arrival date & time: 02/25/17  1331     History   Chief Complaint Chief Complaint  Patient presents with  . Dysuria  . Urinary Frequency    HPI Jenna Norris is a 26 y.o. female.   Yesterday patient developed frequency.  Today she had dysuria and lower back ache.  Patient's last menstrual period was 02/20/2017.  No abdominal or pelvic pain.  No fevers, chills, and sweats.  No nausea/vomiting.   The history is provided by the patient.  Dysuria  Pain quality:  Burning Pain severity:  Mild Onset quality:  Sudden Duration:  1 day Timing:  Constant Progression:  Worsening Chronicity:  New Recent urinary tract infections: no   Relieved by:  Phenazopyridine Worsened by:  Nothing Ineffective treatments:  None tried Urinary symptoms: frequent urination and hesitancy   Urinary symptoms: no discolored urine, no foul-smelling urine, no hematuria and no bladder incontinence   Associated symptoms: flank pain   Associated symptoms: no abdominal pain, no fever, no genital lesions, no nausea, no vaginal discharge and no vomiting   Risk factors: no recurrent urinary tract infections     Past Medical History:  Diagnosis Date  . Hiatal hernia   . Ovarian cyst    left     There are no active problems to display for this patient.   Past Surgical History:  Procedure Laterality Date  . SKIN GRAFT    . 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 Authorizing Provider  Biotin 1000 MCG tablet Take 1,000 mcg by mouth 3 (three) times daily.    [provider]  nitrofurantoin, macrocrystal-monohydrate, (MACROBID) 100 MG capsule Take 1 capsule (100 mg total) by mouth 2 (two) times daily. Take with food. 02/25/17   Lattie Haw, MD  omeprazole (PRILOSEC) 20 MG capsule Take 20 mg by mouth daily  as needed.    [provider]    Family History Family History  Problem Relation Age of Onset  . Diabetes Father   . Heart failure Father   . Hypertension Father   . Hyperlipidemia Father   . Cancer Paternal Grandmother        BREAST AND LEUKEMIA    Social History Social History  Substance Use Topics  . Smoking status: Current Every Day Smoker    Packs/day: 0.50    Types: Cigarettes  . Smokeless tobacco: Never Used     Comment: uses vapes  . Alcohol use Yes     Allergies   Patient has no known allergies.   Review of Systems Review of Systems  Constitutional: Negative for fever.  Gastrointestinal: Negative for abdominal pain, nausea and vomiting.  Genitourinary: Positive for dysuria and flank pain. Negative for vaginal discharge.  All other systems reviewed and are negative.    Physical Exam Triage Vital Signs ED Triage Vitals [02/25/17 1357]  Enc Vitals Group     BP 134/87     Pulse Rate 94     Resp      Temp 98.6 F (37 C)     Temp Source Oral     SpO2 94 %     Weight 204 lb (92.5 kg)     Height      Head Circumference  Peak Flow      Pain Score 2     Pain Loc      Pain Edu?      Excl. in GC?    No data found.   Updated Vital Signs BP 134/87 (BP Location: Left Arm)   Pulse 94   Temp 98.6 F (37 C) (Oral)   Wt 204 lb (92.5 kg)   LMP 02/20/2017   SpO2 94%   BMI 35.02 kg/m   Visual Acuity Right Eye Distance:   Left Eye Distance:   Bilateral Distance:    Right Eye Near:   Left Eye Near:    Bilateral Near:     Physical Exam Nursing notes and Vital Signs reviewed. Appearance:  Patient appears stated age, and in no acute distress.    Eyes:  Pupils are equal, round, and reactive to light and accomodation.  Extraocular movement is intact.  Conjunctivae are not inflamed   Pharynx:  Normal; moist mucous membranes  Neck:  Supple.  No adenopathy Lungs:  Clear to auscultation.  Breath sounds are equal.  Moving air well. Heart:   Regular rate and rhythm without murmurs, rubs, or gallops.  Abdomen:  Mild tenderness over bladder without masses or hepatosplenomegaly.  Bowel sounds are present.  Mild left flank tenderness present.   Extremities:  No edema.  Skin:  No rash present.     UC Treatments / Results  Labs (all labs ordered are listed, but only abnormal results are displayed) Labs Reviewed  URINE CULTURE    EKG  EKG Interpretation None       Radiology No results found.  Procedures Procedures (including critical care time)  Medications Ordered in UC Medications - No data to display   Initial Impression / Assessment and Plan / UC Course  I have reviewed the triage vital signs and the nursing notes.  Pertinent labs & imaging results that were available during my care of the patient were reviewed by me and considered in my medical decision making (see chart for details).    Urine culture pending. Begin Macrobid 100mg  BID for one week. May use non-prescription AZO for about two days, if desired, to decrease urinary discomfort.  Increase fluid intake. If symptoms become significantly worse during the night or over the weekend, proceed to the local emergency room.  Followup with Family Doctor if not improved in one week.     Final Clinical Impressions(s) / UC Diagnoses   Final diagnoses:  Dysuria    New Prescriptions New Prescriptions   NITROFURANTOIN, MACROCRYSTAL-MONOHYDRATE, (MACROBID) 100 MG CAPSULE    Take 1 capsule (100 mg total) by mouth 2 (two) times daily. Take with food.     Lattie HawBeese, Jacklyn Branan A, MD 02/25/17 [phone]

## 2017-02-25 NOTE — ED Triage Notes (Signed)
Patient c/o 2 days of dysuria and urinary frequency. Taken AZO otc.

## 2017-02-26 ENCOUNTER — Telehealth: Payer: Self-pay | Admitting: Emergency Medicine

## 2017-02-26 LAB — URINE CULTURE

## 2018-08-12 ENCOUNTER — Other Ambulatory Visit: Payer: Self-pay | Admitting: Emergency Medicine

## 2018-08-12 ENCOUNTER — Emergency Department (INDEPENDENT_AMBULATORY_CARE_PROVIDER_SITE_OTHER)
Admission: EM | Admit: 2018-08-12 | Discharge: 2018-08-12 | Disposition: A | Discharge status: Home / Self Care | Payer: Self-pay | Source: Home / Self Care | Attending: Emergency Medicine | Admitting: Emergency Medicine

## 2018-08-12 ENCOUNTER — Other Ambulatory Visit: Payer: Self-pay

## 2018-08-12 ENCOUNTER — Encounter: Payer: Self-pay | Admitting: *Deleted

## 2018-08-12 DIAGNOSIS — Z113 Encounter for screening for infections with a predominantly sexual mode of transmission: Secondary | ICD-10-CM

## 2018-08-12 DIAGNOSIS — R300 Dysuria: Secondary | ICD-10-CM

## 2018-08-12 DIAGNOSIS — N3001 Acute cystitis with hematuria: Secondary | ICD-10-CM

## 2018-08-12 LAB — POCT URINALYSIS DIP (MANUAL ENTRY)
Bilirubin, UA: NEGATIVE
Glucose, UA: NEGATIVE mg/dL
Ketones, POC UA: NEGATIVE mg/dL
Nitrite, UA: POSITIVE — AB
Protein Ur, POC: NEGATIVE mg/dL
Spec Grav, UA: 1.01 (ref 1.010–1.025)
Urobilinogen, UA: 0.2 E.U./dL
pH, UA: 6 (ref 5.0–8.0)

## 2018-08-12 MED ORDER — CIPROFLOXACIN HCL 500 MG PO TABS: 500.0000 mg | ORAL_TABLET | Freq: Two times a day (BID) | ORAL | 0 refills | Status: DC

## 2018-08-12 NOTE — ED Triage Notes (Signed)
Pt /o dysuria and frequent urination x toady. She took AZO without relief. Denies fever. She is concerned about STD's.

## 2018-08-12 NOTE — Discharge Instructions (Signed)
Verbal instructions given. She declined AVS Various tests sent including urine culture. Cipro prescribed. Follow-up with PCP if no better 1 week or sooner if worse or new symptoms.  Red flags discussed.  She voiced understanding.

## 2018-08-13 LAB — URINE CULTURE
MICRO NUMBER:: 2479
Result:: NO GROWTH
SPECIMEN QUALITY:: ADEQUATE

## 2018-08-14 LAB — C. TRACHOMATIS/N. GONORRHOEAE RNA
C. trachomatis RNA, TMA: NOT DETECTED
N. gonorrhoeae RNA, TMA: NOT DETECTED

## 2018-08-14 NOTE — ED Provider Notes (Signed)
[facility]    CSN: [id number] Arrival date & time: 08/12/18  1410     History   Chief Complaint Chief Complaint  Patient presents with  . Dysuria    HPI Jenna Norris is a 28 y.o. female.   HPI This is a 28 y.o. female who presents today with UTI symptoms for 2 days.  + dysuria + frequency + urgency No hematuria No vaginal discharge No fever/chills No lower abdominal pain.  Denies pelvic pain. No nausea No vomiting No back pain No fatigue She denies chance of pregnancy. Has tried over-the-counter measures without improvement. She also mentions that she is concerned about testing for STDs.  Denies vaginal discharge.  Has had a total of 2 new sex partners in the past few months.    Past Medical History:  Diagnosis Date  . Hiatal hernia   . Ovarian cyst    left     There are no active problems to display for this patient.   Past Surgical History:  Procedure Laterality Date  . SKIN GRAFT    . 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      Home Medications    Prior to Admission medications   Medication Sig Start Date End Date Taking? Authorizing Provider  ciprofloxacin (CIPRO) 500 MG tablet Take 1 tablet (500 mg total) by mouth 2 (two) times daily. For 7 days 08/12/18   Lajean ManesMassey, , MD    Family History Family History  Problem Relation Age of Onset  . Diabetes Father   . Heart failure Father   . Hypertension Father   . Hyperlipidemia Father   . Cancer Paternal Grandmother        BREAST AND LEUKEMIA    Social History Social History   Tobacco Use  . Smoking status: Former Smoker    Packs/day: 0.50    Types: Cigarettes    Last attempt to quit: 08/12/2016    Years since quitting: 2.0  . Smokeless tobacco: Never Used  . Tobacco comment: uses vapes  Substance Use Topics  . Alcohol use: Yes    Comment: 3-4 QD  . Drug use: No     Allergies   Patient has no known  allergies.   Review of Systems Review of Systems  All other systems reviewed and are negative.    Physical Exam Triage Vital Signs ED Triage Vitals  Enc Vitals Group     BP 08/12/18 1459 136/86     Pulse Rate 08/12/18 1459 77     Resp 08/12/18 1459 18     Temp 08/12/18 1459 97.6 F (36.4 C)     Temp Source 08/12/18 1459 Oral     SpO2 08/12/18 1459 97 %     Weight 08/12/18 1501 210 lb (95.3 kg)     Height 08/12/18 1501 5\' 4"  (1.626 m)     Head Circumference --      Peak Flow --      Pain Score 08/12/18 1501 0     Pain Loc --      Pain Edu? --      Excl. in GC? --    No data found.  Updated Vital Signs BP 136/86 (BP Location: Right Arm)   Pulse 77   Temp 97.6 F (36.4 C) (Oral)   Resp 18   Ht 5\' 4"  (1.626 m)   Wt 95.3 kg   LMP 08/12/2018  SpO2 97%   BMI 36.05 kg/m   Visual Acuity Right Eye Distance:   Left Eye Distance:   Bilateral Distance:    Right Eye Near:   Left Eye Near:    Bilateral Near:     Physical Exam Vitals signs and nursing note reviewed.  Constitutional:      General: She is not in acute distress.    Appearance: She is well-developed.  Eyes:     General: No scleral icterus. Neck:     Musculoskeletal: Neck supple.  Cardiovascular:     Rate and Rhythm: Normal rate and regular rhythm.     Heart sounds: Normal heart sounds.  Pulmonary:     Breath sounds: Normal breath sounds.  Abdominal:     Palpations: Abdomen is soft. There is no mass.     Tenderness: There is abdominal tenderness in the suprapubic area. There is no guarding or rebound.  Lymphadenopathy:     Cervical: No cervical adenopathy.  Skin:    General: Skin is warm and dry.  Neurological:     Mental Status: She is alert and oriented to person, place, and time.      UC Treatments / Results  Labs (all labs ordered are listed, but only abnormal results are displayed) Labs Reviewed  POCT URINALYSIS DIP (MANUAL ENTRY) - Abnormal; Notable for the following components:       Result Value   Blood, UA small (*)    Nitrite, UA Positive (*)    Leukocytes, UA Small (1+) (*)    All other components within normal limits  URINE CULTURE  CERVICOVAGINAL ANCILLARY ONLY    EKG None  Radiology No results found.  Procedures Procedures (including critical care time)  Medications Ordered in UC Medications - No data to display  Initial Impression / Assessment and Plan / UC Course  I have reviewed the triage vital signs and the nursing notes.  Pertinent labs & imaging results that were available during my care of the patient were reviewed by me and considered in my medical decision making (see chart for details).     Likely has acute cystitis, but will order self vaginal swab (at patient request) for GC, chlamydia, BV and trichomonas. I offered and advised blood tests to screen for HIV and RPR, but she declined Final Clinical Impressions(s) / UC Diagnoses   Final diagnoses:  Dysuria  Acute cystitis with hematuria  Screening for STD (sexually transmitted disease)     Discharge Instructions     Verbal instructions given. She declined AVS Various tests sent including urine culture. Cipro prescribed. Follow-up with PCP if no better 1 week or sooner if worse or new symptoms.  Red flags discussed.  She voiced understanding.    ED Prescriptions    Medication Sig Dispense Auth. Provider   ciprofloxacin (CIPRO) 500 MG tablet Take 1 tablet (500 mg total) by mouth 2 (two) times daily. For 7 days 14 tablet Lajean Manes, MD     Precautions and red flags discussed.   Lajean Manes, MD 08/14/18 1349

## 2018-08-17 ENCOUNTER — Telehealth: Payer: Self-pay

## 2018-08-17 NOTE — Telephone Encounter (Signed)
Notified patient of all lab results except for the affirm test.  I explained that we would notify her of the results once they are back.

## 2019-07-09 ENCOUNTER — Emergency Department (INDEPENDENT_AMBULATORY_CARE_PROVIDER_SITE_OTHER)
Admission: EM | Admit: 2019-07-09 | Discharge: 2019-07-09 | Disposition: A | Discharge status: Home / Self Care | Payer: Self-pay | Source: Home / Self Care | Attending: Emergency Medicine | Admitting: Emergency Medicine

## 2019-07-09 ENCOUNTER — Other Ambulatory Visit: Payer: Self-pay

## 2019-07-09 ENCOUNTER — Emergency Department (INDEPENDENT_AMBULATORY_CARE_PROVIDER_SITE_OTHER): Payer: Self-pay

## 2019-07-09 DIAGNOSIS — M5412 Radiculopathy, cervical region: Secondary | ICD-10-CM

## 2019-07-09 DIAGNOSIS — M542 Cervicalgia: Secondary | ICD-10-CM

## 2019-07-09 DIAGNOSIS — M25511 Pain in right shoulder: Secondary | ICD-10-CM

## 2019-07-09 DIAGNOSIS — R200 Anesthesia of skin: Secondary | ICD-10-CM

## 2019-07-09 MED ORDER — GABAPENTIN 100 MG PO CAPS: 100.0000 mg | ORAL_CAPSULE | Freq: Three times a day (TID) | ORAL | 0 refills | Status: DC

## 2019-07-09 MED ORDER — PREGABALIN 50 MG PO CAPS: 50.0000 mg | ORAL_CAPSULE | Freq: Two times a day (BID) | ORAL | 0 refills | Status: DC

## 2019-07-09 MED ORDER — PREDNISONE 20 MG PO TABS: ORAL_TABLET | ORAL | 0 refills | Status: AC

## 2019-07-09 NOTE — ED Triage Notes (Signed)
About 3 weeks ago slept on shoulder wrong.  Had numbness and tingling.  Has rested it and used a tens unit the last couple of weeks.  Saw chiropractor on Tuesday, and has throbbing and pain since.

## 2019-07-09 NOTE — ED Provider Notes (Addendum)
Go to the [facility] URGENT CARE    CSN: [id number] Arrival date & time: 07/09/19  1201      History   Chief Complaint Chief Complaint  Patient presents with  . Shoulder Pain    right    HPI Jenna Norris is a 28 y.o. female.  Patient enters with a 3-week history of severe pain in the right side of her neck which radiates down into her right arm.  She does have weakness of grip strength involving the right hand.  She has had numbness involving the arm down to her hand.  She has been to the chiropractor and undergone manipulation.  She last went on Tuesday and following in this manipulation has had increasing pain and discomfort.  She has been taken ibuprofen and no other medications. HPI  Past Medical History:  Diagnosis Date  . Hiatal hernia   . Ovarian cyst    left     There are no active problems to display for this patient.   Past Surgical History:  Procedure Laterality Date  . SKIN GRAFT    . 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      Home Medications    Prior to Admission medications   Medication Sig Start Date End Date Taking? Authorizing Provider  ciprofloxacin (CIPRO) 500 MG tablet Take 1 tablet (500 mg total) by mouth 2 (two) times daily. For 7 days 08/12/18   Lajean Manes, MD  predniSONE (DELTASONE) 20 MG tablet Take 3 PO QAM x3days, 2 PO QAM x3days, 1 PO QAM x3days 07/09/19   Collene Gobble, MD  pregabalin (LYRICA) 50 MG capsule Take 1 capsule (50 mg total) by mouth 2 (two) times daily. 07/09/19   Collene Gobble, MD  gabapentin (NEURONTIN) 100 MG capsule Take 1 capsule (100 mg total) by mouth 3 (three) times daily. 07/09/19 07/09/19  Collene Gobble, MD    Family History Family History  Problem Relation Age of Onset  . Diabetes Father   . Heart failure Father   . Hypertension Father   . Hyperlipidemia Father   . Cancer Paternal Grandmother        BREAST AND LEUKEMIA    Social History Social  History   Tobacco Use  . Smoking status: Former Smoker    Packs/day: 0.50    Types: Cigarettes    Quit date: 08/12/2016    Years since quitting: 2.9  . Smokeless tobacco: Never Used  . Tobacco comment: uses vapes  Substance Use Topics  . Alcohol use: Yes    Comment: 3-4 QD  . Drug use: No     Allergies   Patient has no known allergies.   Review of Systems Review of Systems  Musculoskeletal:       Severe pain in the right side of the neck which radiates down the right arm and into the right hand with decreased grip strength of the right hand.     Physical Exam Triage Vital Signs ED Triage Vitals  Enc Vitals Group     BP 07/09/19 1314 133/85     Pulse Rate 07/09/19 1314 68     Resp 07/09/19 1314 20     Temp 07/09/19 1314 98.3 F (36.8 C)     Temp Source 07/09/19 1314 Oral     SpO2 07/09/19 1314 99 %     Weight 07/09/19 1315 225 lb (102.1 kg)  Height 07/09/19 1315 5\' 4"  (1.626 m)     Head Circumference --      Peak Flow --      Pain Score 07/09/19 1315 6     Pain Loc --      Pain Edu? --      Excl. in [place]? --    No data found.  Updated Vital Signs BP 133/85 (BP Location: Left Arm)   Pulse 68   Temp 98.3 F (36.8 C) (Oral)   Resp 20   Ht 5\' 4"  (1.626 m)   Wt 102.1 kg   LMP 06/30/2019   SpO2 99%   BMI 38.62 kg/m   Visual Acuity Right Eye Distance:   Left Eye Distance:   Bilateral Distance:    Right Eye Near:   Left Eye Near:    Bilateral Near:     Physical Exam Constitutional:      Appearance: Normal appearance.  Neck:     Comments: There is tenderness along the right side of the neck which extends to the right deltoid area there is significant pain with extension of the neck and turning of the neck to the left. Neurological:     General: No focal deficit present.     Mental Status: She is alert and oriented to person, place, and time.     Cranial Nerves: No cranial nerve deficit.     Sensory: No sensory deficit.     Comments: Right  brachial radialis and triceps deep tendon reflexes appear to be diminished compared to the left.  Psychiatric:        Mood and Affect: Mood normal.        Behavior: Behavior normal.      UC Treatments / Results  Labs (all labs ordered are listed, but only abnormal results are displayed) Labs Reviewed - No data to display  EKG   Radiology Dg Cervical Spine Complete  Result Date: 07/09/2019 CLINICAL DATA:  Cervicalgia EXAM: CERVICAL SPINE - COMPLETE 4+ VIEW COMPARISON:  None. FINDINGS: Frontal, lateral, open-mouth odontoid, and bilateral oblique views were obtained. There is no fracture or spondylolisthesis. Prevertebral soft tissues and predental space regions are normal. The disc spaces appear normal. There is no appreciable exit foraminal narrowing on the oblique views. There is reversal of lordotic curvature. Lung apices are clear. IMPRESSION: Reversal of lordotic curvature, finding most likely indicative of a degree of muscle spasm. No fracture or spondylolisthesis. No appreciable arthropathy. Electronically Signed   By: Lowella Grip III M.D.   On: 07/09/2019 13:48   Dg Shoulder Right  Result Date: 07/09/2019 CLINICAL DATA:  Pain and numbness EXAM: RIGHT SHOULDER - 2+ VIEW COMPARISON:  Dec 16, 2015 FINDINGS: Oblique, Y scapular, and axillary images obtained. No fracture or dislocation. Joint spaces appear normal. No erosive change. Visualized right lung clear. IMPRESSION: No fracture or dislocation.  No evident arthropathy. Electronically Signed   By: Lowella Grip III M.D.   On: 07/09/2019 13:47    Procedures Procedures (including critical care time)  Medications Ordered in UC Medications - No data to display  Initial Impression / Assessment and Plan / UC Course  I have reviewed the triage vital signs and the nursing notes. Patient has what appears to be a radiculopathy in her right arm.  X-rays showed only evidence of spasm.  Will treat with prednisone and a taper  along with Lyrica to see if we can get her some relief.  She was instructed to follow-up with Dr. Dimitri Ped  next-door or with her chiropractor. Pertinent labs & imaging results that were available during my care of the patient were reviewed by me and considered in my medical decision making (see chart for details).     Final Clinical Impressions(s) / UC Diagnoses   Final diagnoses:  Cervical radiculopathy  Acute pain of right shoulder     Discharge Instructions     Take medication as instructed. Do not take ibuprofen with the prednisone Take Neurontin 100 mg 1 to 2 capsules 3 times a day. Contact your chiropractor on Monday if not improving to see if you can proceed with an MRI. Wear your sling as needed.    ED Prescriptions    Medication Sig Dispense Auth. Provider   predniSONE (DELTASONE) 20 MG tablet Take 3 PO QAM x3days, 2 PO QAM x3days, 1 PO QAM x3days 18 tablet Collene Gobbleaub, Nollan Muldrow A, MD   gabapentin (NEURONTIN) 100 MG capsule  (Status: Discontinued) Take 1 capsule (100 mg total) by mouth 3 (three) times daily. 40 capsule Collene Gobbleaub, Berley Gambrell A, MD   pregabalin (LYRICA) 50 MG capsule Take 1 capsule (50 mg total) by mouth 2 (two) times daily. 20 capsule Collene Gobbleaub, Casmer Yepiz A, MD     PDMP not reviewed this encounter.   Collene Gobbleaub, Nalleli Largent A, MD 07/09/19 1507    Collene Gobbleaub, Dannica Bickham A, MD 07/09/19 1541    Collene Gobbleaub, Areona Homer A, MD 07/09/19 [phone]

## 2019-07-09 NOTE — Discharge Instructions (Addendum)
Take medication as instructed. Do not take ibuprofen with the prednisone Take Neurontin 100 mg 1 to 2 capsules 3 times a day. Contact your chiropractor on Monday if not improving to see if you can proceed with an MRI. Wear your sling as needed.

## 2019-07-15 ENCOUNTER — Other Ambulatory Visit: Payer: Self-pay

## 2019-07-15 ENCOUNTER — Encounter: Payer: Self-pay | Admitting: Sports Medicine

## 2019-07-15 ENCOUNTER — Ambulatory Visit (INDEPENDENT_AMBULATORY_CARE_PROVIDER_SITE_OTHER): Payer: Self-pay | Admitting: Sports Medicine

## 2019-07-15 DIAGNOSIS — M5412 Radiculopathy, cervical region: Secondary | ICD-10-CM | POA: Insufficient documentation

## 2019-07-15 MED ORDER — OXYCODONE-ACETAMINOPHEN 5-325 MG PO TABS: 1.0000 | ORAL_TABLET | Freq: Three times a day (TID) | ORAL | 0 refills | Status: AC | PRN

## 2019-07-15 MED ORDER — METHYLPREDNISOLONE SODIUM SUCC 125 MG IJ SOLR
125.0000 mg | Freq: Once | INTRAMUSCULAR | Status: AC
  Administered 2019-07-15: 125 mg via INTRAMUSCULAR

## 2019-07-15 MED ORDER — KETOROLAC TROMETHAMINE 60 MG/2ML IM SOLN
60.0000 mg | Freq: Once | INTRAMUSCULAR | Status: AC
  Administered 2019-07-15: 60 mg via INTRAMUSCULAR

## 2019-07-15 MED ORDER — GABAPENTIN 300 MG PO CAPS: ORAL_CAPSULE | ORAL | 3 refills | Status: AC

## 2019-07-15 NOTE — Assessment & Plan Note (Signed)
Continue prednisone taper from urgent care, increasing gabapentin to 300 mg. Toradol 60, Solu-Medrol 125 intramuscular, adding an MRI as she is having weakness, formal physical therapy. Return to see me in 4 weeks.

## 2019-07-15 NOTE — Progress Notes (Signed)
Subjective:    CC: Acute neck pain  HPI:  This is a pleasant 28 year old female, she has a recent history of severe neck pain with radiation down the right arm with progressive weakness.  She was seen in urgent care, started on prednisone, gabapentin which is only helping slightly. Symptoms are severe, persistent, localized.  No constitutional symptoms, no trauma.  I reviewed the past medical history, family history, social history, surgical history, and allergies today and no changes were needed.  Please see the problem list section below in epic for further details.  Past Medical History: Past Medical History:  Diagnosis Date  . Hiatal hernia   . Ovarian cyst    left    Past Surgical History: Past Surgical History:  Procedure Laterality Date  . SKIN GRAFT    . 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 Marital status: Single    Spouse name: Not on file  . Number of children: Not on file  . Years of education: Not on file  . Highest education level: Not on file  Occupational History  . Not on file  Social Needs  . Financial resource strain: Not on file  . Food insecurity    Worry: Not on file    Inability: Not on file  . Transportation needs    Medical: Not on file    Non-medical: Not on file  Tobacco Use  . Smoking status: Former Smoker    Packs/day: 0.50    Types: Cigarettes    Quit date: 08/12/2016    Years since quitting: 2.9  . Smokeless tobacco: Never Used  . Tobacco comment: uses vapes  Substance and Sexual Activity  . Alcohol use: Yes    Comment: 3-4 QD  . Drug use: No  . Sexual activity: Yes    Birth control/protection: I.U.D.  Lifestyle  . Physical activity    Days per week: Not on file    Minutes per session: Not on file  . Stress: Not on file  Relationships  . Social Herbalist on phone: Not on file    Gets together: Not on file    Attends religious service: Not on file   Active member of club or organization: Not on file    Attends meetings of clubs or organizations: Not on file    Relationship status: Not on file  Other Topics Concern  . Not on file  Social History Narrative  . Not on file   Family History: Family History  Problem Relation Age of Onset  . Diabetes Father   . Heart failure Father   . Hypertension Father   . Hyperlipidemia Father   . Cancer Paternal Grandmother        BREAST AND LEUKEMIA   Allergies: No Known Allergies Medications: See med rec.  Review of Systems: No headache, visual changes, nausea, vomiting, diarrhea, constipation, dizziness, abdominal pain, skin rash, fevers, chills, night sweats, swollen lymph nodes, weight loss, chest pain, body aches, joint swelling, muscle aches, shortness of breath, mood changes, visual or auditory hallucinations.  Objective:    General: Well Developed, well nourished, and in no acute distress.  Neuro: Alert and oriented x3, extra-ocular muscles intact, sensation grossly intact.  HEENT: Normocephalic, atraumatic, pupils equal round reactive to light, neck supple, no masses, no lymphadenopathy, thyroid nonpalpable.  Skin: Warm and dry, no rashes noted.  Cardiac: Regular rate and rhythm, no murmurs  rubs or gallops.  Respiratory: Clear to auscultation bilaterally. Not using accessory muscles, speaking in full sentences.  Abdominal: Soft, nontender, nondistended, positive bowel sounds, no masses, no organomegaly.  Neck: Negative spurling's Full neck range of motion Grip strength and sensation normal in bilateral hands Strength good C4 to T1 distribution No sensory change to C4 to T1 Reflexes normal  Impression and Recommendations:    The patient was counselled, risk factors were discussed, anticipatory guidance given.  Right cervical radiculopathy Continue prednisone taper from urgent care, increasing gabapentin to 300 mg. Toradol 60, Solu-Medrol 125 intramuscular, adding an MRI as  she is having weakness, formal physical therapy. Return to see me in 4 weeks.   ___________________________________________ Ihor Austin. Benjamin Stain, M.D., ABFM., CAQSM. Primary Care and Sports Medicine [facility]  Adjunct Professor of Family Medicine  [employer]

## 2019-07-18 ENCOUNTER — Ambulatory Visit (INDEPENDENT_AMBULATORY_CARE_PROVIDER_SITE_OTHER): Payer: Self-pay

## 2019-07-18 ENCOUNTER — Other Ambulatory Visit: Payer: Self-pay

## 2019-07-18 DIAGNOSIS — M5412 Radiculopathy, cervical region: Secondary | ICD-10-CM

## 2019-07-20 ENCOUNTER — Ambulatory Visit (INDEPENDENT_AMBULATORY_CARE_PROVIDER_SITE_OTHER): Payer: Self-pay | Admitting: Rehabilitative and Restorative Service Providers"

## 2019-07-20 ENCOUNTER — Other Ambulatory Visit: Payer: Self-pay

## 2019-07-20 ENCOUNTER — Telehealth: Payer: Self-pay | Admitting: Rehabilitative and Restorative Service Providers"

## 2019-07-20 DIAGNOSIS — M6281 Muscle weakness (generalized): Secondary | ICD-10-CM

## 2019-07-20 DIAGNOSIS — R293 Abnormal posture: Secondary | ICD-10-CM

## 2019-07-20 DIAGNOSIS — M542 Cervicalgia: Secondary | ICD-10-CM

## 2019-07-20 NOTE — Patient Instructions (Signed)
Access Code: [email or web address]  URL: [email or web address]  Date: 07/20/2019  Prepared by: Rudell Cobb   Program Notes  Do not let your head drop forward during standing exercises. Spot something straight ahead.   Exercises Sidelying Thoracic Rotation with Open Book - 10 reps - 1 sets - 2x daily - 7x weekly Supine Thoracic Mobilization Towel Roll Vertical with Arm Stretch - 1 reps - 1 sets - 2 minutes hold - 2x daily - 7x weekly Doorway Pec Stretch at 90 Degrees Abduction - 3 reps - 1 sets - 30 seconds hold - 2x daily - 7x weekly Doorway Pec Stretch at 60 Degrees Abduction with Arm Straight - 3 reps - 1 sets - 30 seconds hold - 2x daily - 7x weekly Standing Shoulder W at Wall - 10 reps - 1 sets - 2x daily - 7x weekly

## 2019-07-20 NOTE — Therapy (Signed)
[facility] [place] [address] [place], [place], [address] Phone: [phone]   Fax:  [phone]  Physical Therapy Evaluation  Patient Details  Name: Jenna Norris MRN: [id number] Date of Birth: 09-05-1990 Referring Provider (PT): Monica Becton MD   Encounter Date: 07/20/2019  PT End of Session - 07/20/19 1327    Visit Number  1    Number of Visits  8    Date for PT Re-Evaluation  09/03/19    Authorization Type  self pay    PT Start Time  1140    PT Stop Time  1225    PT Time Calculation (min)  45 min    Activity Tolerance  Patient tolerated treatment well;No increased pain    Behavior During Therapy  WFL for tasks assessed/performed       Past Medical History:  Diagnosis Date  . Hiatal hernia   . Ovarian cyst    left     Past Surgical History:  Procedure Laterality Date  . SKIN GRAFT    . 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   Subjective Assessment - 07/20/19 1146    Subjective  The patient awoke with R UE tingling and numbness in about a month ago.  She sought care at chiropractor's office who initially sent her away because of tightness and then saw her 2 weeks later. She underwent a manipulation and has different symptoms of difficulty writing (gets pain under shoulder blade).  Medications are assisting in pain reduction.  Severity=2/10 at rest, bumps up to 5/10 at worst (writing is most provoking position). Intensity= stays with her 10 minutes after 5 minutes of writing.  Nature=achiness and throbbing.  Stage=subacute.    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  Yes    Pain Score  5     Pain Location  Shoulder    Pain Orientation  Right    Pain Descriptors / Indicators  Aching    Pain Type  Acute pain    Pain Onset  More than a month ago    Pain Frequency   Intermittent    Aggravating Factors   writing    Pain Relieving Factors  rest         [facility] PT Assessment - 07/20/19 0001      Assessment   Medical Diagnosis  M54.12 (ICD-10-CM) - Right cervical radiculopathy    Referring Provider (PT)  Monica Becton MD    Onset Date/Surgical Date  --   estimates one month ago   Hand Dominance  Right    Prior Therapy  none; initially tried chiropractic care      Balance Screen   Has the patient fallen in the past 6 months  No    Has the patient had a decrease in activity level because of a fear of falling?   No    Is the patient reluctant to leave their home because of a fear of falling?   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      ROM /  Strength   AROM / PROM / Strength  AROM;Strength      AROM   Overall AROM   Deficits    Overall AROM Comments  *pain and tightness noted R levator region and upper trap    AROM Assessment Site  Shoulder;Cervical    Right/Left Shoulder  Right;Left    Right Shoulder Extension  45 Degrees    Right Shoulder Flexion  170 Degrees    Right Shoulder ABduction  170 Degrees    Right Shoulder Internal Rotation  70 Degrees    Right Shoulder External Rotation  80 Degrees    Left Shoulder Extension  45 Degrees    Left Shoulder Flexion  170 Degrees    Left Shoulder ABduction  170 Degrees    Left Shoulder Internal Rotation  70 Degrees    Left Shoulder External Rotation  80 Degrees    Cervical Flexion  WFL    Cervical Extension  WFL    Cervical - Right Side Bend  WFL    Cervical - Left Side Bend  WFL    Cervical - Right Rotation  60    Cervical - Left Rotation  60   with pain     Strength   Overall Strength  Deficits    Overall Strength Comments  Pain with R shoulder flexion/abduction with MMT 4/5.  5/5 for bilateral elbow  flexion/extension and L shoulder flexion/extension.    Pain is in R upper trapezius and superior shoulder region.      Special Tests    Special Tests  Cervical    Cervical Tests  Dictraction;Spurling's                Objective measurements completed on examination: See above findings.      [facility] Adult PT Treatment/Exercise - 07/20/19 1205      Exercises   Exercises  Neck      Neck Exercises: Standing   Other Standing Exercises  standing door frame stretches at 60 and 90  x 2 reps x 30 seconds    Other Standing Exercises  scapular retraction "w" x 10 reps      Neck Exercises: Supine   Other Supine Exercise  towel roll stretch supine x 2 minutes      Neck Exercises: Sidelying   Other Sidelying Exercise  sidelying thoracic opening x 10 reps             PT Education - 07/20/19 1326    Education Details  HEP initiated    Starwood HotelsPerson(s) Educated  Patient    Methods  Explanation;Demonstration;Handout    Comprehension  Returned demonstration;Verbalized understanding          PT Long Term Goals - 07/20/19 1327      PT LONG TERM GOAL #1   Title  The patient will be indep with HEP for postural strengthening, stretching.    Time  6    Period  Weeks    Target Date  09/03/19      PT LONG TERM GOAL #2   Title  The patient will improve R UE strength to 5/5 shoulder flexion and abduction.    Time  6    Period  Weeks    Target Date  09/03/19      PT LONG TERM GOAL #3   Title  The patient wil report reduced limitation from 39% to < or equal to 24%.    Time  6    Period  Weeks  Target Date  09/03/19      PT LONG TERM GOAL #4   Title  The patient will report no pain R levator region with L rotation end range and L sidebending.    Time  6    Period  Weeks    Target Date  09/03/19      PT LONG TERM GOAL #5   Title  The patient will tolerate work activities with pain <or equal to 2/10.    Baseline  5/10 with writing x 5 minutes.    Time  6    Period  Weeks              Plan - 07/20/19 1330    Clinical Impression Statement  The patient is a 28 yo female presenting with outpatient physical therapy for R cervical radiculitis.  She presents with pain R upper trapezius and levator region with end range L rotation and L sidebending, R shoulder weakness with pain with shoulder flexion/abduction, R parascapular pain with writing.  The patient's impairments are limiting participation in work related tasks.    Examination-Activity Limitations  Lift    Stability/Clinical Decision Making  Stable/Uncomplicated    Clinical Decision Making  Low    Rehab Potential  Good    PT Frequency  2x / week   *wrote for 8 visits due to traveling over holidays and self pay status   PT Duration  6 weeks    PT Treatment/Interventions  ADLs/Self Care Home Management;Neuromuscular re-education;Therapeutic activities;Therapeutic exercise;Cryotherapy;Electrical Stimulation;Iontophoresis 4mg /ml Dexamethasone;Moist Heat;Traction;Ultrasound;Patient/family education;Manual techniques;Spinal Manipulations;Dry needling;Passive range of motion;Taping    PT Next Visit Plan  progress HEP working on upper trap/levator stretching, scapular strengthening, postural training for work.  May further assess joint mobility thoracic spine at following visits.    PT Home Exercise Plan  [email or web address]    Consulted and Agree with Plan of Care  Patient       Patient will benefit from skilled therapeutic intervention in order to improve the following deficits and impairments:  Pain, Decreased strength, Postural dysfunction, Impaired flexibility  Visit Diagnosis: Cervicalgia  Abnormal posture  Muscle weakness (generalized)     Problem List Patient Active Problem List   Diagnosis Date Noted  . Right cervical radiculopathy 07/15/2019    Yunis Voorheis, PT 07/20/2019, 1:35 PM  [facility] [address] [place], [place],  [address] Phone: [phone]   Fax:  [phone]  Name: Jenna Norris MRN: [id number] Date of Birth: 1991/07/14

## 2019-07-22 ENCOUNTER — Ambulatory Visit (INDEPENDENT_AMBULATORY_CARE_PROVIDER_SITE_OTHER): Payer: Self-pay | Admitting: Physical Therapy

## 2019-07-22 ENCOUNTER — Other Ambulatory Visit: Payer: Self-pay

## 2019-07-22 DIAGNOSIS — M6281 Muscle weakness (generalized): Secondary | ICD-10-CM

## 2019-07-22 DIAGNOSIS — M542 Cervicalgia: Secondary | ICD-10-CM

## 2019-07-22 DIAGNOSIS — R293 Abnormal posture: Secondary | ICD-10-CM

## 2019-07-22 NOTE — Patient Instructions (Signed)
Decompression Exercise: Arm Support    Lie on back on firm surface, knees bent, feet flat, arms turned up, out to sides, backs of hands down. Support under arms: towel. Time _5-15__ minutes. Surface: floor    Over Head Pull: Narrow Grip        On back, knees bent, feet flat, band across thighs, elbows straight but relaxed. Pull hands apart (start). Keeping elbows straight, bring arms up and over head, hands toward floor. Keep pull steady on band. Hold momentarily. Return slowly, keeping pull steady, back to start. Repeat _10__ times. Band color __yellow____   Side Pull: Double Arm   On back, knees bent, feet flat. Arms perpendicular to body, shoulder level, elbows straight but relaxed. Pull arms out to sides, elbows straight. Resistance band comes across collarbones, hands toward floor. Hold momentarily. Slowly return to starting position. Repeat _10__ times. Band color _yellow____   Sash   On back, knees bent, feet flat, left hand on left hip, right hand above left. Pull right arm DIAGONALLY (hip to shoulder) across chest. Bring right arm along head toward floor. Hold momentarily. Slowly return to starting position. Repeat _10__ times. Do with left arm. Band color __yellow____   Shoulder Rotation: Double Arm   On back, knees bent, feet flat, elbows tucked at sides, bent 90, hands palms up. Pull hands apart and down toward floor, keeping elbows near sides. Hold momentarily. Slowly return to starting position. Repeat __10_ times. Band color __yellow____   Prudencio Pair in the [employer]: Double Arm    Arms near sides, palms up. Press both arms lightly into floor, slide arms out to side and up alongside head. Keep contact with floor throughout motion. At maximal position, lengthen arms. Hold _5__ seconds. Relax. Slide arms back to start. Repeat __10_ times.   Kinesiology tape What is kinesiology tape?  There are many brands of kinesiology tape.  KTape, [employer], [employer], Dynamic tape,  to name a few. It is an elasticized tape designed to support the body's natural healing process. This tape provides stability and support to muscles and joints without restricting motion. It can also help decrease swelling in the area of application. How does it work? The tape microscopically lifts and decompresses the skin to allow for drainage of lymph (swelling) to flow away from area, reducing inflammation.  The tape has the ability to help re-educate the neuromuscular system by targeting specific receptors in the skin.  The presence of the tape increases the body's awareness of posture and body mechanics.  Do not use with: . Open wounds . Skin lesions . Adhesive allergies Safe removal of the tape: In some rare cases, mild/moderate skin irritation can occur.  This can include redness, itchiness, or hives. If this occurs, immediately remove tape and consult your primary care physician if symptoms are severe or do not resolve within 2 days.  To remove tape safely, hold nearby skin with one hand and gentle roll tape down with other hand.  You can apply oil or conditioner to tape while in shower prior to removal to loosen adhesive.  DO NOT swiftly rip tape off like a band-aid, as this could cause skin tears and additional skin irritation.

## 2019-07-22 NOTE — Therapy (Signed)
[place], [place], [address] Phone: [phone]   Fax:  [phone]  Physical Therapy Treatment  Patient Details  Name: Jenna Norris MRN: [id number] Date of Birth: September 19, 1990 Referring Provider (PT): Silverio Decamp MD   Encounter Date: 07/22/2019  PT End of Session - 07/22/19 1153    Visit Number  2    Number of Visits  8    Date for PT Re-Evaluation  09/03/19    Authorization Type  self pay    PT Start Time  1150    PT Stop Time  1227    PT Time Calculation (min)  37 min    Activity Tolerance  Patient tolerated treatment well;No increased pain    Behavior During Therapy  WFL for tasks assessed/performed       Past Medical History:  Diagnosis Date  . Hiatal hernia   . Ovarian cyst    left     Past Surgical History:  Procedure Laterality Date  . SKIN GRAFT    . 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  Subjective Assessment - 07/22/19 1155    Subjective  Pt reports symptoms are worse, as it is end of the work week. She has been doing the exercises.    Currently in Pain?  Yes    Pain Score  3     Pain Location  Shoulder    Pain Orientation  Right    Pain Descriptors / Indicators  Throbbing    Aggravating Factors   writing    Pain Relieving Factors  rest         [facility] PT Assessment - 07/22/19 0001      Assessment   Medical Diagnosis  M54.12 (ICD-10-CM) - Right cervical radiculopathy    Referring Provider (PT)  Silverio Decamp MD    Onset Date/Surgical Date  --   estimates one month ago   Hand Dominance  Right    Prior Therapy  none; initially tried chiropractic care      [facility] Adult PT Treatment/Exercise - 07/22/19 0001      Self-Care   Self-Care  Other Self-Care Comments;Posture    Posture  Educated pt on importance of upright posture thoughout day to reduce strain on neck; discussed possible modifications  to work position.     Other Self-Care Comments   Discussed use of TENS for pain management, including pad placement and parameters;       Neck Exercises: Standing   Other Standing Exercises  scapular retraction "w" x 8 reps, 3 sec hold       Neck Exercises: Supine   Shoulder Flexion  Both;10 reps   overhead pull; to tolerance   Upper Extremity D2  Flexion;10 reps;Theraband   RUE/LUE, yellow band   Other Supine Exercise  decompression position x 5 min;  then 10 snow angels.     Other Supine Exercise  Horizontal abdct x 10 with yellow band; bilat shoulder ER with yellow band x 10       Neck Exercises: Sidelying   Other Sidelying Exercise  sidelying thoracic opening x 10 reps, to Rt. trial of Lt x 3 reps       Manual Therapy   Manual Therapy  Taping;Soft tissue mobilization    Soft tissue mobilization  IASTM to Rt posterior  neck, Rt upper trap, levator, rhomboid, midtrap, scalenes - to decrease fascial  tightness and improve ROM.     Kinesiotex  IT consultantCreate Space      Kinesiotix   Create Space  I strip of reg Rock tape applied over Rt levator with 15% stretch and perpendicular strip over center of piece with 50% stretch to decompress area.       Neck Exercises: Stretches   Levator Stretch  Left;1 rep;10 seconds   to tolerance   Other Neck Stretches  midlevel doorway stretch x 15 sec x 3 reps (cues to hold stretch longer) Rt tricep stretch with wall assist x 15 sec            PT Education - 07/22/19 1241    Education Details  HEP (through [employer]) and info on kinesiotape, issued yellow band.    Person(s) Educated  Patient    Methods  Explanation;Handout;Verbal cues;Demonstration    Comprehension  Verbalized understanding;Returned demonstration          PT Long Term Goals - 07/20/19 1327      PT LONG TERM GOAL #1   Title  The patient will be indep with HEP for postural strengthening, stretching.    Time  6    Period  Weeks    Target Date  09/03/19      PT LONG TERM GOAL #2    Title  The patient will improve R UE strength to 5/5 shoulder flexion and abduction.    Time  6    Period  Weeks    Target Date  09/03/19      PT LONG TERM GOAL #3   Title  The patient wil report reduced limitation from 39% to < or equal to 24%.    Time  6    Period  Weeks    Target Date  09/03/19      PT LONG TERM GOAL #4   Title  The patient will report no pain R levator region with L rotation end range and L sidebending.    Time  6    Period  Weeks    Target Date  09/03/19      PT LONG TERM GOAL #5   Title  The patient will tolerate work activities with pain <or equal to 2/10.    Baseline  5/10 with writing x 5 minutes.    Time  6    Period  Weeks            Plan - 07/22/19 1307    Clinical Impression Statement  Visible and palpable swelling noted over Rt periscapular musculature; trial of light IASTM and kinesiology tape applied to assist with pain and edema reduction.  HEP progressed; exercises modified to tolerance.  Pt reported slight increase in discomfort in Rt shoulder with resistive work.  Goals are ongoing at this time.    Examination-Activity Limitations  Lift    Stability/Clinical Decision Making  Stable/Uncomplicated    Rehab Potential  Good    PT Frequency  2x / week   *wrote for 8 visits due to traveling over holidays and self pay status   PT Duration  6 weeks    PT Treatment/Interventions  ADLs/Self Care Home Management;Neuromuscular re-education;Therapeutic activities;Therapeutic exercise;Cryotherapy;Electrical Stimulation;Iontophoresis 4mg /ml Dexamethasone;Moist Heat;Traction;Ultrasound;Patient/family education;Manual techniques;Spinal Manipulations;Dry needling;Passive range of motion;Taping    PT Next Visit Plan  assess response to tape and HEP;   May further assess joint mobility thoracic spine at following visits.    PT Home Exercise Plan  [id number], VHI exercises.    Consulted and Agree with  Plan of Care  Patient       Patient will benefit from  skilled therapeutic intervention in order to improve the following deficits and impairments:  Pain, Decreased strength, Postural dysfunction, Impaired flexibility  Visit Diagnosis: Cervicalgia  Abnormal posture  Muscle weakness (generalized)     Problem List Patient Active Problem List   Diagnosis Date Noted  . Right cervical radiculopathy 07/15/2019   Mayer Camel, PTA 07/22/19 1:13 PM  [facility] [place] [address] [place], [place], [address] Phone: [phone]   Fax:  [phone]  Name: Oprah Camarena MRN: [id number] Date of Birth: 07-Dec-1990

## 2019-07-27 ENCOUNTER — Encounter: Payer: Self-pay | Admitting: Rehabilitative and Restorative Service Providers"

## 2019-08-12 ENCOUNTER — Ambulatory Visit: Payer: Self-pay | Admitting: Sports Medicine

## 2019-09-02 ENCOUNTER — Encounter: Payer: Self-pay | Admitting: Rehabilitative and Restorative Service Providers"

## 2019-09-02 NOTE — Therapy (Signed)
[place], [place], [address] Phone: [phone]   Fax:  [phone]  Patient Details  Name: Jenna Norris MRN: [id number] Date of Birth: [birth year] Referring Provider:  Silverio Decamp, MD   Encounter Date: last encounter 07/22/19  PHYSICAL THERAPY DISCHARGE SUMMARY  Visits from Start of Care: 2  Current functional level related to goals / functional outcomes: See eval note for patient status-- did not return.   Remaining deficits: See eval   Education / Equipment: HEP  Plan: Patient agrees to discharge.  Patient goals were not met. Patient is being discharged due to not returning since the last visit.  ?????       Thank you for the referral of this patient. Rudell Cobb, MPT   [place] 09/02/2019, 1:41 PM  [facility] [place], [place], [address] Phone: [phone]   Fax:  [phone]

## 2019-10-06 NOTE — Telephone Encounter (Signed)
Created in error

## 2021-02-19 ENCOUNTER — Ambulatory Visit
Admission: RE | Admit: 2021-02-19 | Discharge: 2021-02-19 | Disposition: A | Discharge status: Home / Self Care | Payer: Self-pay | Source: Ambulatory Visit | Attending: Family Medicine | Admitting: Family Medicine

## 2021-02-19 ENCOUNTER — Other Ambulatory Visit: Payer: Self-pay

## 2021-02-19 VITALS — BP 135/85 | HR 77 | Temp 98.4°F | Resp 18

## 2021-02-19 DIAGNOSIS — M25461 Effusion, right knee: Secondary | ICD-10-CM

## 2021-02-19 DIAGNOSIS — N390 Urinary tract infection, site not specified: Secondary | ICD-10-CM

## 2021-02-19 DIAGNOSIS — N760 Acute vaginitis: Secondary | ICD-10-CM

## 2021-02-19 DIAGNOSIS — Z3202 Encounter for pregnancy test, result negative: Secondary | ICD-10-CM

## 2021-02-19 LAB — POCT URINALYSIS DIP (MANUAL ENTRY)
Bilirubin, UA: NEGATIVE
Blood, UA: NEGATIVE
Glucose, UA: 100 mg/dL — AB
Ketones, POC UA: NEGATIVE mg/dL
Leukocytes, UA: NEGATIVE
Nitrite, UA: POSITIVE — AB
Protein Ur, POC: NEGATIVE mg/dL
Spec Grav, UA: 1.015 (ref 1.010–1.025)
Urobilinogen, UA: 1 E.U./dL
pH, UA: 5.5 (ref 5.0–8.0)

## 2021-02-19 LAB — POCT URINE PREGNANCY: Preg Test, Ur: NEGATIVE

## 2021-02-19 MED ORDER — NITROFURANTOIN MONOHYD MACRO 100 MG PO CAPS: 100.0000 mg | ORAL_CAPSULE | Freq: Two times a day (BID) | ORAL | 0 refills | Status: AC

## 2021-02-19 MED ORDER — PREDNISONE 20 MG PO TABS: 40.0000 mg | ORAL_TABLET | Freq: Every day | ORAL | 0 refills | Status: AC

## 2021-02-19 NOTE — ED Triage Notes (Addendum)
C/O dysuria and polyuria onset yesterday AM.  Has been using AZO.  C/O significant vaginal irritation and inflammation.  Reports having had unprotected sex last wk and wishes to have screening for STDs and pregnancy.  Has been using OTC Vagisil.  C/O right knee "extreme tightness as if there is a large fluid build-up", onset 3 days ago with progressive worsening.  Denies injury.

## 2021-02-21 LAB — CERVICOVAGINAL ANCILLARY ONLY
Bacterial Vaginitis (gardnerella): POSITIVE — AB
Candida Glabrata: NEGATIVE
Candida Vaginitis: NEGATIVE
Chlamydia: NEGATIVE
Comment: NEGATIVE
Comment: NEGATIVE
Comment: NEGATIVE
Comment: NEGATIVE
Comment: NEGATIVE
Comment: NORMAL
Neisseria Gonorrhea: NEGATIVE
Trichomonas: NEGATIVE

## 2021-02-21 NOTE — ED Provider Notes (Signed)
[facility]    CSN: [id number] Arrival date & time: 02/19/21  1650      History   Chief Complaint Chief Complaint  Patient presents with   Dysuria   Vaginal Itching   Knee Problem    HPI Jenna Norris is a 30 y.o. female.   Patient presenting today with multiple complaints.  She states she has been having 1 day history of dysuria, urinary frequency.  Has been using Azo off and on which does help for a short period of time, but as soon as it wears off her symptoms return.  History of frequent urinary tract infections that feels similar.  She is also having some diffuse external vaginal irritation and inflammation but no discharge.  She is wondering if this is from how frequently she is using scented baby wipes with her urinary symptoms and frequent urination but did have unprotected sex last week while on vacation and went swimming in the ocean which she is also afraid is irritating things in that area.  Tried using badges still with no benefit.  She is also having acute on chronic right knee pain and swelling x3 days.  It started flaring up as she was on vacation, walking on the beach often.  The swelling is diffuse and feels like a tightness around the entire knee.  Very painful to bear weight on the knee at this point.  No known injury.  Trying rest, elevation with no relief.  Has had bilateral knee issues for several years now with intermittent flares.   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      Home Medications    Prior to Admission medications   Medication Sig Start Date End Date Taking? Authorizing Provider  nitrofurantoin, macrocrystal-monohydrate,  (MACROBID) 100 MG capsule Take 1 capsule (100 mg total) by mouth 2 (two) times daily. 02/19/21  Yes Particia Nearing, PA-C  predniSONE (DELTASONE) 20 MG tablet Take 2 tablets (40 mg total) by mouth daily with breakfast. 02/19/21  Yes Particia Nearing, PA-C  gabapentin (NEURONTIN) 300 MG capsule One tab PO qHS for a week, then BID for a week, then TID. May double weekly to a max of 3,600mg /day 07/15/19   Monica Becton, MD  oxyCODONE-acetaminophen (PERCOCET/ROXICET) 5-325 MG tablet Take 1 tablet by mouth every 8 (eight) hours as needed. 07/15/19   Monica Becton, MD  predniSONE (DELTASONE) 20 MG tablet Take 3 PO QAM x3days, 2 PO QAM x3days, 1 PO QAM x3days 07/09/19   Collene Gobble, MD    Family History Family History  Problem Relation Age of Onset   Diabetes Father    Heart failure Father    Hypertension Father    Hyperlipidemia Father    Cancer Paternal Grandmother        BREAST AND LEUKEMIA    Social History Social History   Tobacco Use   Smoking status: Former    Packs/day: 0.50    Pack years: 0.00    Types: Cigarettes    Quit date: 08/12/2016    Years since quitting: 4.5  Smokeless tobacco: Never   Tobacco comments:    uses vapes  Vaping Use   Vaping Use: Every day   Substances: Nicotine  Substance Use Topics   Alcohol use: Yes    Comment: 2-3 drinks/wk   Drug use: No     Allergies   Patient has no known allergies.   Review of Systems Review of Systems Per HPI  Physical Exam Triage Vital Signs ED Triage Vitals  Enc Vitals Group     BP 02/19/21 1806 135/85     Pulse Rate 02/19/21 1806 77     Resp 02/19/21 1806 18     Temp 02/19/21 1806 98.4 F (36.9 C)     Temp Source 02/19/21 1806 Temporal     SpO2 02/19/21 1806 97 %     Weight --      Height --      Head Circumference --      Peak Flow --      Pain Score 02/19/21 1807 4     Pain Loc --      Pain Edu? --      Excl. in GC? --    No data found.  Updated Vital Signs BP  135/85   Pulse 77   Temp 98.4 F (36.9 C) (Temporal)   Resp 18   LMP 02/08/2021 (Approximate)   SpO2 97%   Visual Acuity Right Eye Distance:   Left Eye Distance:   Bilateral Distance:    Right Eye Near:   Left Eye Near:    Bilateral Near:     Physical Exam Vitals and nursing note reviewed. Exam conducted with a chaperone present.  Constitutional:      Appearance: Normal appearance. She is not ill-appearing.  HENT:     Head: Atraumatic.  Eyes:     Extraocular Movements: Extraocular movements intact.     Conjunctiva/sclera: Conjunctivae normal.  Cardiovascular:     Rate and Rhythm: Normal rate and regular rhythm.     Heart sounds: Normal heart sounds.  Pulmonary:     Effort: Pulmonary effort is normal.     Breath sounds: Normal breath sounds.  Abdominal:     General: Bowel sounds are normal. There is no distension.     Palpations: Abdomen is soft.     Tenderness: There is no abdominal tenderness. There is no right CVA tenderness, left CVA tenderness or guarding.  Genitourinary:    Comments: Diffuse erythema and mild edema external vulvar region.  No vaginal discharge present on internal exam, vagina benign Musculoskeletal:        General: Swelling and tenderness present. Normal range of motion.     Cervical back: Normal range of motion and neck supple.     Comments: Diffuse right knee edema extending up into distal quadricep muscles.  Decreased range of motion due to pain and swelling.  No joint laxity, negative McMurray's  Skin:    General: Skin is warm and dry.  Neurological:     Mental Status: She is alert and oriented to person, place, and time.  Psychiatric:        Mood and Affect: Mood normal.        Thought Content: Thought content normal.        Judgment: Judgment normal.     UC Treatments / Results  Labs (all labs ordered are listed, but only abnormal results are displayed) Labs Reviewed  POCT URINALYSIS DIP (MANUAL ENTRY) - Abnormal; Notable for the  following components:  Result Value   Color, UA orange (*)    Glucose, UA =100 (*)    Nitrite, UA Positive (*)    All other components within normal limits  CERVICOVAGINAL ANCILLARY ONLY - Abnormal; Notable for the following components:   Bacterial Vaginitis (gardnerella) Positive (*)    All other components within normal limits  URINE CULTURE  POCT URINE PREGNANCY    EKG   Radiology No results found.  Procedures Procedures (including critical care time)  Medications Ordered in UC Medications - No data to display  Initial Impression / Assessment and Plan / UC Course  I have reviewed the triage vital signs and the nursing notes.  Pertinent labs & imaging results that were available during my care of the patient were reviewed by me and considered in my medical decision making (see chart for details).     UA appears positive for urinary tract infection, though she is on Azo.  We will send out for urine culture and a vaginal swab, these are both pending at this time.  We will start Macrobid for a urinary tract infection while awaiting these results given her history of frequent urinary tract infections.  We will also start some prednisone for her acute on chronic right knee swelling.  Strongly recommended sports medicine follow-up given the chronicity of her knee issues and in case soft tissue imaging needed to be performed for further evaluation.  Return precautions reviewed.  Return for acutely worsening symptoms at any time.  Final Clinical Impressions(s) / UC Diagnoses   Final diagnoses:  Acute lower UTI  Acute vaginitis  Effusion of right knee  Negative pregnancy test   Discharge Instructions   None    ED Prescriptions     Medication Sig Dispense Auth. Provider   predniSONE (DELTASONE) 20 MG tablet Take 2 tablets (40 mg total) by mouth daily with breakfast. 10 tablet Particia Nearing, PA-C   nitrofurantoin, macrocrystal-monohydrate, (MACROBID) 100 MG  capsule Take 1 capsule (100 mg total) by mouth 2 (two) times daily. 10 capsule Particia Nearing, [place]      PDMP not reviewed this encounter.   Particia Nearing, [place] 02/21/21 2129

## 2021-02-23 ENCOUNTER — Telehealth (HOSPITAL_COMMUNITY): Payer: Self-pay | Admitting: Emergency Medicine

## 2021-02-23 LAB — URINE CULTURE: Culture: 20000 — AB

## 2021-02-23 MED ORDER — METRONIDAZOLE 500 MG PO TABS: 500.0000 mg | ORAL_TABLET | Freq: Two times a day (BID) | ORAL | 0 refills | Status: AC

## 2021-10-19 IMAGING — MR MR CERVICAL SPINE W/O CM
5 series · 42 of 48 positions shown · non-contrast
Comparison: None.

CLINICAL DATA: Cervical radiculopathy with right arm weakness

EXAM:
MRI CERVICAL SPINE WITHOUT CONTRAST
TECHNIQUE: Multiplanar, multisequence MR imaging of the cervical spine was
performed. No intravenous contrast was administered.

[Series 2: T2 · sagittal · 3.0mm · 0.69mm/px · 6 of 13 slices shown (1 of 2)]
[im 1/13]
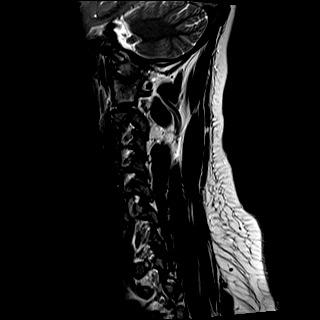
[im 3/13]
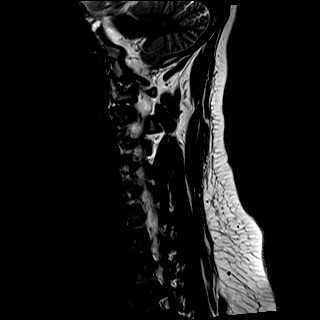
[im 5/13]
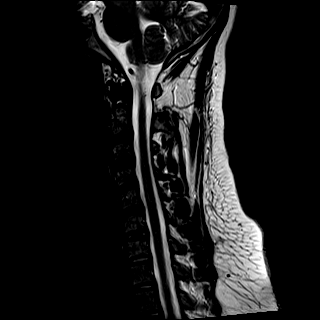
[im 8/13]
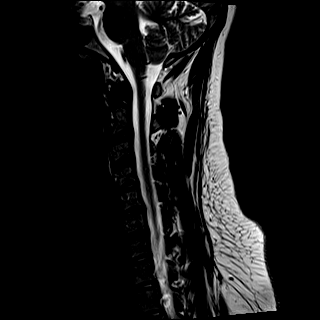
[im 10/13]
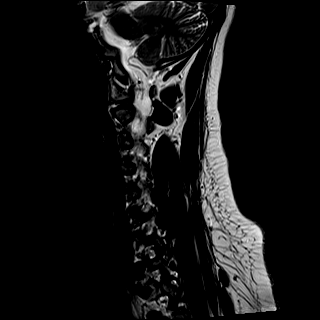
[im 13/13]
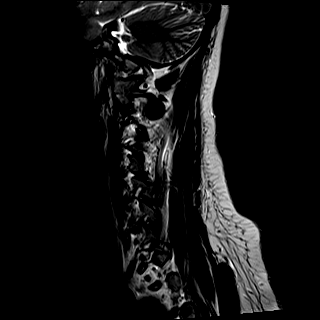

[Series 3: T1 · sagittal · 3.0mm · 0.86mm/px · 7 of 13 slices shown]
[im 1/13]
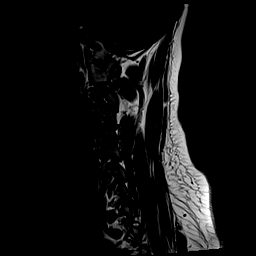
[im 3/13]
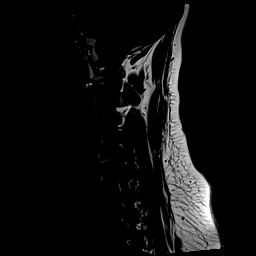
[im 5/13]
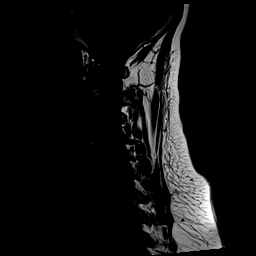
[im 7/13]
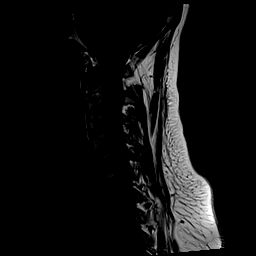
[im 9/13]
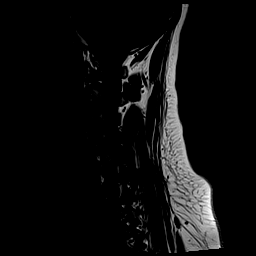
[im 11/13]
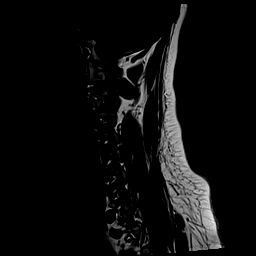
[im 13/13]
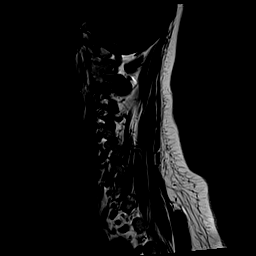

[Series 4: STIR · sagittal · 3.0mm · 0.69mm/px · 7 of 13 slices shown]
[im 1/13]
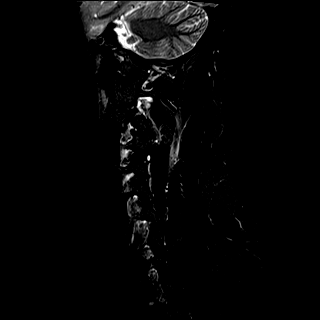
[im 3/13]
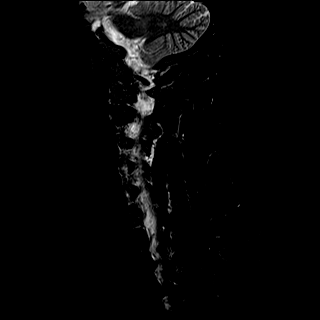
[im 5/13]
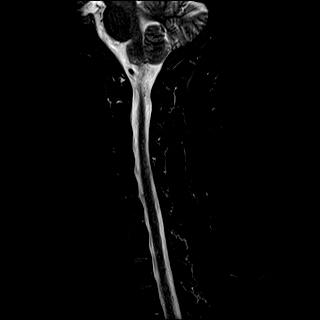
[im 7/13]
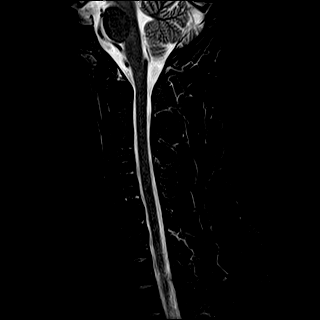
[im 9/13]
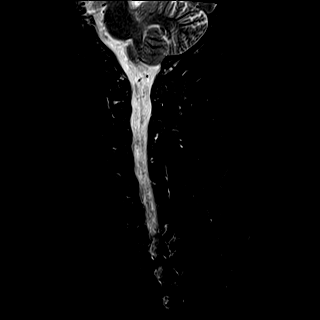
[im 11/13]
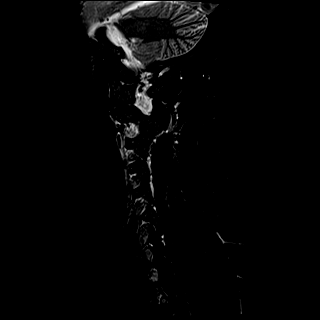
[im 13/13]
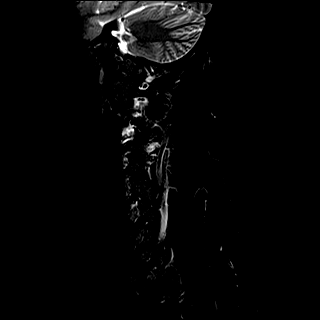

[Series 5: T2 · axial · 3.0mm · 0.62mm/px · z∈[-31,+73]mm · 14 of 28 slices shown (2 of 2)]
[im 1/28]
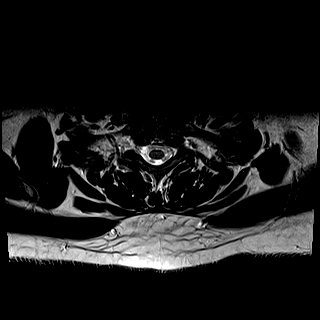
[im 3/28]
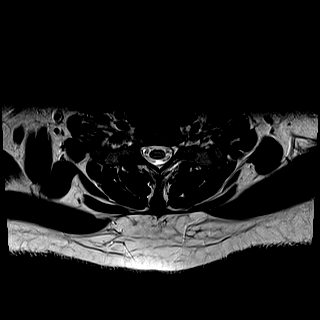
[im 5/28]
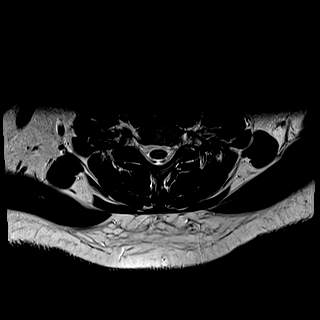
[im 7/28]
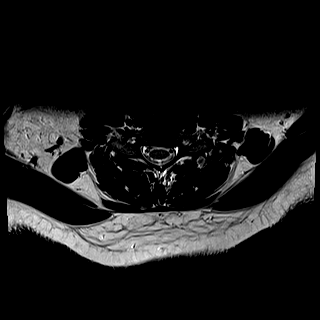
[im 9/28]
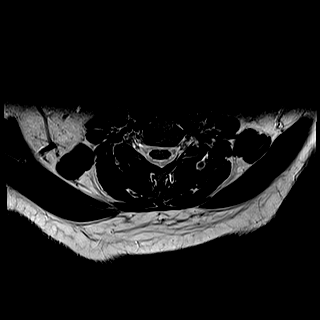
[im 11/28]
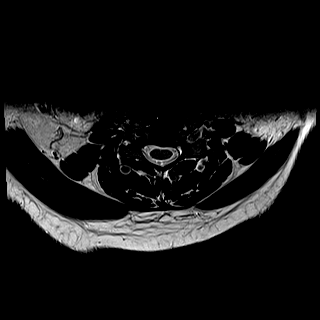
[im 13/28]
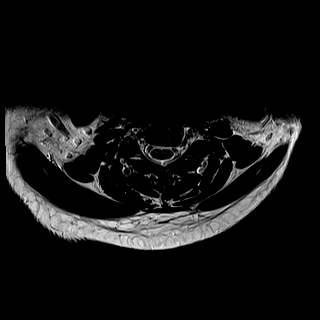
[im 15/28]
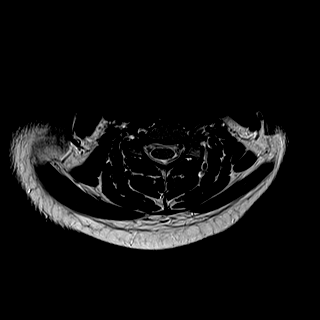
[im 17/28]
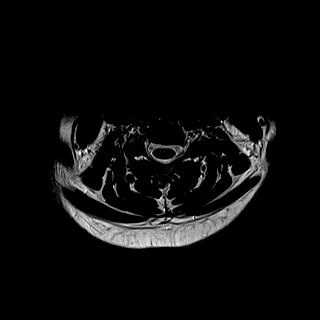
[im 19/28]
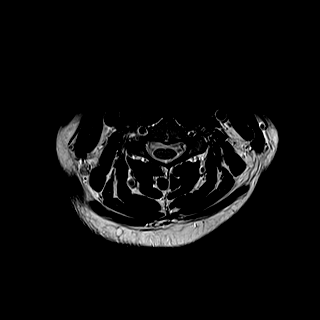
[im 21/28]
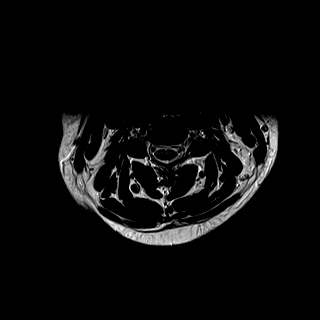
[im 23/28]
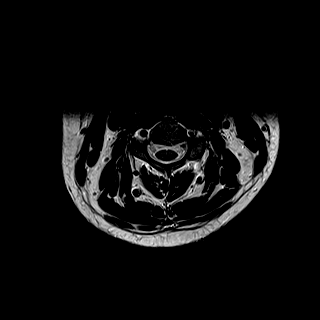
[im 25/28]
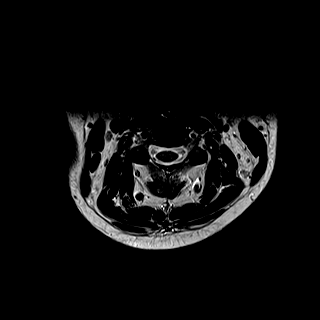
[im 28/28]
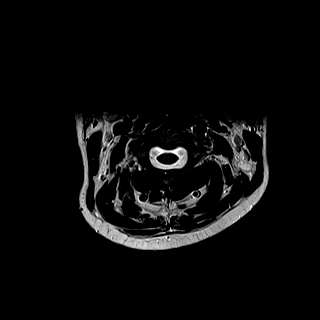

[Series 6: mpgr ax · axial · 3.0mm · 0.35mm/px · z∈[-25,+79]mm · 8 of 28 slices shown]
[im 1/28]
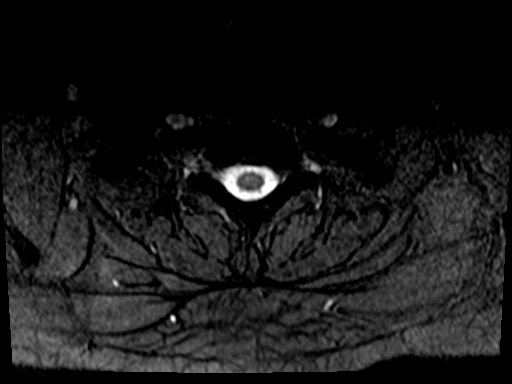
[im 5/28]
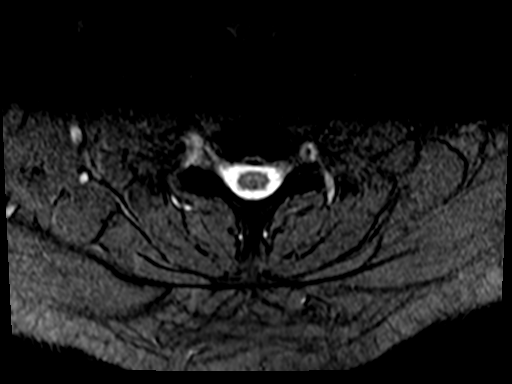
[im 9/28]
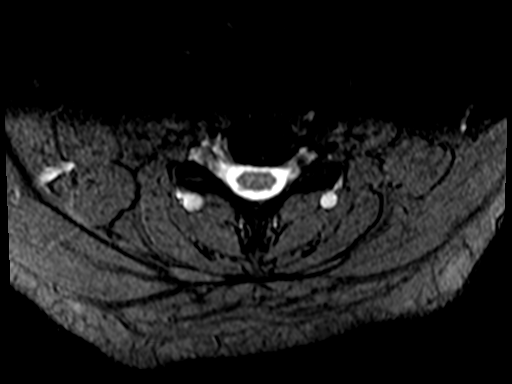
[im 13/28]
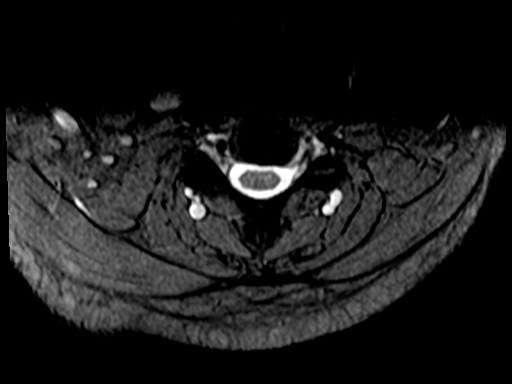
[im 15/28]
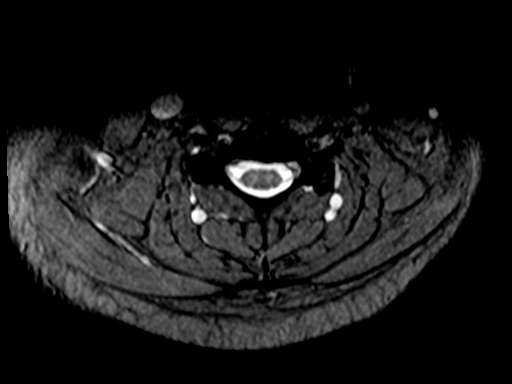
[im 19/28]
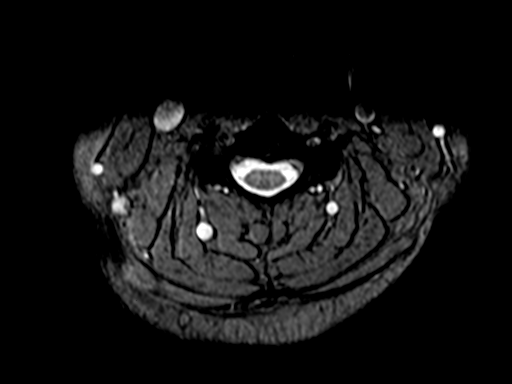
[im 23/28]
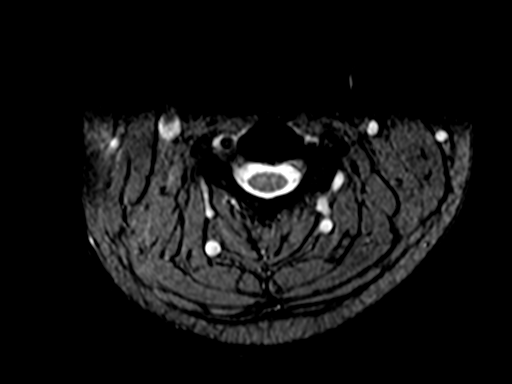
[im 28/28]
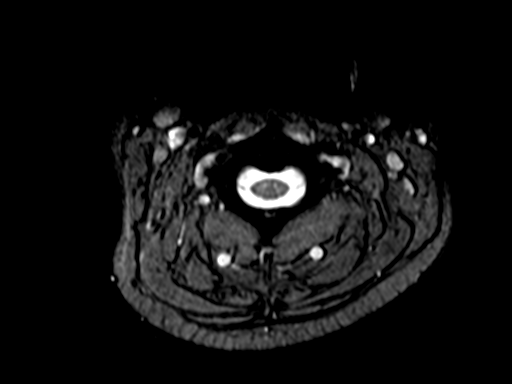

[42 of 48 positions shown; findings below may reference images not displayed]

FINDINGS: Alignment: Normal

Vertebrae: No fracture, evidence of discitis, or bone lesion.

Cord: Normal signal and morphology.

Posterior Fossa, vertebral arteries, paraspinal tissues: Negative.

Disc levels:

They were is no disc herniation. No spinal canal or neural foraminal
stenosis.
IMPRESSION: Normal MRI of the cervical spine.

## 2022-12-18 ENCOUNTER — Emergency Department (HOSPITAL_COMMUNITY): Payer: Self-pay

## 2022-12-18 ENCOUNTER — Other Ambulatory Visit: Payer: Self-pay

## 2022-12-18 ENCOUNTER — Emergency Department (HOSPITAL_COMMUNITY)
Admission: EM | Admit: 2022-12-18 | Discharge: 2022-12-18 | Disposition: A | Discharge status: Home / Self Care | Payer: Self-pay | Attending: Emergency Medicine | Admitting: Emergency Medicine

## 2022-12-18 ENCOUNTER — Encounter (HOSPITAL_COMMUNITY): Payer: Self-pay

## 2022-12-18 DIAGNOSIS — K209 Esophagitis, unspecified without bleeding: Secondary | ICD-10-CM | POA: Insufficient documentation

## 2022-12-18 LAB — LIPASE, BLOOD: Lipase: 24 U/L (ref 11–51)

## 2022-12-18 LAB — BASIC METABOLIC PANEL
Anion gap: 9 (ref 5–15)
BUN: 10 mg/dL (ref 6–20)
CO2: 20 mmol/L — ABNORMAL LOW (ref 22–32)
Calcium: 8.9 mg/dL (ref 8.9–10.3)
Chloride: 107 mmol/L (ref 98–111)
Creatinine, Ser: 0.75 mg/dL (ref 0.44–1.00)
GFR, Estimated: >60 mL/min (ref >60)
Glucose, Bld: 102 mg/dL — ABNORMAL HIGH (ref 70–99)
Potassium: 4.1 mmol/L (ref 3.5–5.1)
Sodium: 136 mmol/L (ref 135–145)

## 2022-12-18 LAB — HEPATIC FUNCTION PANEL
ALT: 26 U/L (ref 0–44)
AST: 20 U/L (ref 15–41)
Albumin: 4.1 g/dL (ref 3.5–5.0)
Alkaline Phosphatase: 58 U/L (ref 38–126)
Bilirubin, Direct: 0.1 mg/dL (ref 0.0–0.2)
Indirect Bilirubin: 0.7 mg/dL (ref 0.3–0.9)
Total Bilirubin: 0.8 mg/dL (ref 0.3–1.2)
Total Protein: 7 g/dL (ref 6.5–8.1)

## 2022-12-18 LAB — CBC
HCT: 41.8 % (ref 36.0–46.0)
Hemoglobin: 14.5 g/dL (ref 12.0–15.0)
MCH: 31.9 pg (ref 26.0–34.0)
MCHC: 34.7 g/dL (ref 30.0–36.0)
MCV: 92.1 fL (ref 80.0–100.0)
Platelets: 271 10*3/uL (ref 150–400)
RBC: 4.54 MIL/uL (ref 3.87–5.11)
RDW: 11.4 % — ABNORMAL LOW (ref 11.5–15.5)
WBC: 8.1 10*3/uL (ref 4.0–10.5)
nRBC: 0 % (ref 0.0–0.2)

## 2022-12-18 LAB — TROPONIN I (HIGH SENSITIVITY): Troponin I (High Sensitivity): <2 ng/L (ref <18)

## 2022-12-18 LAB — I-STAT BETA HCG BLOOD, ED (MC, WL, AP ONLY): I-stat hCG, quantitative: <5 m[IU]/mL (ref <5)

## 2022-12-18 MED ORDER — HYOSCYAMINE SULFATE 0.125 MG SL SUBL
0.2500 mg | SUBLINGUAL_TABLET | Freq: Once | SUBLINGUAL | Status: AC
  Administered 2022-12-18: 0.25 mg via SUBLINGUAL
  Filled 2022-12-18: qty 2

## 2022-12-18 MED ORDER — PANTOPRAZOLE SODIUM 40 MG PO TBEC: 40.0000 mg | DELAYED_RELEASE_TABLET | Freq: Every day | ORAL | 0 refills | Status: AC

## 2022-12-18 MED ORDER — SUCRALFATE 1 G PO TABS
1.0000 g | ORAL_TABLET | Freq: Once | ORAL | Status: AC
  Administered 2022-12-18: 1 g via ORAL
  Filled 2022-12-18: qty 1

## 2022-12-18 MED ORDER — SUCRALFATE 1 G PO TABS: 1.0000 g | ORAL_TABLET | Freq: Four times a day (QID) | ORAL | 0 refills | Status: AC

## 2022-12-18 MED ORDER — ALUM & MAG HYDROXIDE-SIMETH 200-200-20 MG/5ML PO SUSP
30.0000 mL | Freq: Once | ORAL | Status: AC
  Administered 2022-12-18: 30 mL via ORAL
  Filled 2022-12-18: qty 30

## 2022-12-18 NOTE — ED Provider Notes (Signed)
[place] [facility] AT [facility] Provider Note   CSN: [id number] Arrival date & time: 12/18/22  1156     History  Chief Complaint  Patient presents with   Chest Pain    Jenna Norris is a 32 y.o. female.  32 year old female presents with epigastric pain which began just prior to arrival.  Patient states that this has been recurrent.  Has been diagnosed with a hiatal hernia in the past.  No cardiac or pulmonary symptoms.  Denies any fever, emesis.  No history of abdominal surgery.  No treatment use prior to arrival       Home Medications Prior to Admission medications   Medication Sig Start Date End Date Taking? Authorizing Provider  gabapentin (NEURONTIN) 300 MG capsule One tab PO qHS for a week, then BID for a week, then TID. May double weekly to a max of 3,600mg /day 07/15/19   Monica Becton, MD  metroNIDAZOLE (FLAGYL) 500 MG tablet Take 1 tablet (500 mg total) by mouth 2 (two) times daily. 02/23/21   Lamptey, Britta Mccreedy, MD  nitrofurantoin, macrocrystal-monohydrate, (MACROBID) 100 MG capsule Take 1 capsule (100 mg total) by mouth 2 (two) times daily. 02/19/21   Particia Nearing, PA-C  oxyCODONE-acetaminophen (PERCOCET/ROXICET) 5-325 MG tablet Take 1 tablet by mouth every 8 (eight) hours as needed. 07/15/19   Monica Becton, MD  predniSONE (DELTASONE) 20 MG tablet Take 3 PO QAM x3days, 2 PO QAM x3days, 1 PO QAM x3days 07/09/19   Collene Gobble, MD  predniSONE (DELTASONE) 20 MG tablet Take 2 tablets (40 mg total) by mouth daily with breakfast. 02/19/21   Particia Nearing, PA-C      Allergies    Patient has no known allergies.    Review of Systems   Review of Systems  All other systems reviewed and are negative.   Physical Exam Updated Vital Signs BP (!) 169/113   Pulse (!) 116   Temp 98.5 F (36.9 C) (Oral)   Resp (!) 22   Ht 1.626 m (5\' 4" )   Wt 108.9 kg   SpO2 97%   BMI 41.20 kg/m  Physical Exam Vitals and nursing  note reviewed.  Constitutional:      General: She is not in acute distress.    Appearance: Normal appearance. She is well-developed. She is not toxic-appearing.  HENT:     Head: Normocephalic and atraumatic.  Eyes:     General: Lids are normal.     Conjunctiva/sclera: Conjunctivae normal.     Pupils: Pupils are equal, round, and reactive to light.  Neck:     Thyroid: No thyroid mass.     Trachea: No tracheal deviation.  Cardiovascular:     Rate and Rhythm: Normal rate and regular rhythm.     Heart sounds: Normal heart sounds. No murmur heard.    No gallop.  Pulmonary:     Effort: Pulmonary effort is normal. No respiratory distress.     Breath sounds: Normal breath sounds. No stridor. No decreased breath sounds, wheezing, rhonchi or rales.  Abdominal:     General: There is no distension.     Palpations: Abdomen is soft.     Tenderness: There is abdominal tenderness in the epigastric area. There is no guarding or rebound.    Musculoskeletal:        General: No tenderness. Normal range of motion.     Cervical back: Normal range of motion and neck supple.  Skin:  General: Skin is warm and dry.     Findings: No abrasion or rash.  Neurological:     Mental Status: She is alert and oriented to person, place, and time. Mental status is at baseline.     GCS: GCS eye subscore is 4. GCS verbal subscore is 5. GCS motor subscore is 6.     Cranial Nerves: No cranial nerve deficit.     Sensory: No sensory deficit.     Motor: Motor function is intact.  Psychiatric:        Attention and Perception: Attention normal.        Speech: Speech normal.        Behavior: Behavior normal.     ED Results / Procedures / Treatments   Labs (all labs ordered are listed, but only abnormal results are displayed) Labs Reviewed  CBC - Abnormal; Notable for the following components:      Result Value   RDW 11.4 (*)    All other components within normal limits  BASIC METABOLIC PANEL  LIPASE, BLOOD   HEPATIC FUNCTION PANEL  I-STAT BETA HCG BLOOD, ED ([facility], [facility], [facility] ONLY)  TROPONIN I (HIGH SENSITIVITY)    EKG None  Radiology DG Chest 2 View  Result Date: 12/18/2022 CLINICAL DATA:  Chest pain.  Dysuria and polyuria since yesterday. EXAM: CHEST - 2 VIEW COMPARISON:  None Available. FINDINGS: The heart size and mediastinal contours are within normal limits. Both lungs are clear. The visualized skeletal structures are unremarkable. IMPRESSION: No active cardiopulmonary disease. Electronically Signed   By: Burman Nieves M.D.   On: 12/18/2022 12:20    Procedures Procedures    Medications Ordered in ED Medications  alum & mag hydroxide-simeth (MAALOX/MYLANTA) 200-200-20 MG/5ML suspension 30 mL (has no administration in time range)  hyoscyamine (LEVSIN SL) SL tablet 0.25 mg (has no administration in time range)  sucralfate (CARAFATE) tablet 1 g (has no administration in time range)    ED Course/ Medical Decision Making/ A&P                             Medical Decision Making Amount and/or Complexity of Data Reviewed Labs: ordered. Radiology: ordered.  Risk OTC drugs. Prescription drug management.  Chest x-ray per my interpretation shows no acute findings.  EKG per interpretation shows sinus tachycardia.  No concern for PE. Treated for likely reflux here and feels better.  Labs are reassuring.  No evidence of this being a gallbladder issue.  No concern for ACS.  Rate improved after therapy.  Will discharge        Final Clinical Impression(s) / ED Diagnoses Final diagnoses:  None    Rx / DC Orders ED Discharge Orders     None         Lorre Nick, MD 12/18/22 1442

## 2022-12-18 NOTE — ED Triage Notes (Signed)
Epigastric/midsternal chest pain that started 20 minutes PTA, pt reports stabbing/pressure pain that feels like a spear. Pain radiates into back. Denies sob, reports pain with inspiration.  Hx of same episodes, but reports this is worse.
